# Patient Record
Sex: Female | Born: 1990 | Race: Black or African American | Hispanic: No | Marital: Married | Smoking: Never smoker
Health system: Southern US, Community
[De-identification: ages and names within clinical notes are randomized; demographics above are authoritative.]

## PROBLEM LIST (undated history)

## (undated) ENCOUNTER — Inpatient Hospital Stay (HOSPITAL_COMMUNITY): Payer: Self-pay

## (undated) DIAGNOSIS — D649 Anemia, unspecified: Secondary | ICD-10-CM

## (undated) DIAGNOSIS — O139 Gestational [pregnancy-induced] hypertension without significant proteinuria, unspecified trimester: Secondary | ICD-10-CM

## (undated) DIAGNOSIS — O24419 Gestational diabetes mellitus in pregnancy, unspecified control: Secondary | ICD-10-CM

## (undated) HISTORY — PX: WISDOM TOOTH EXTRACTION: SHX21

## (undated) HISTORY — DX: Gestational diabetes mellitus in pregnancy, unspecified control: O24.419

## (undated) HISTORY — PX: NO PAST SURGERIES: SHX2092

---

## 2014-01-04 ENCOUNTER — Encounter (HOSPITAL_COMMUNITY): Payer: Self-pay | Admitting: *Deleted

## 2014-01-04 ENCOUNTER — Inpatient Hospital Stay (HOSPITAL_COMMUNITY)
Admission: AD | Admit: 2014-01-04 | Discharge: 2014-01-04 | Disposition: A | Discharge status: Home / Self Care | Payer: Medicaid Other | Source: Ambulatory Visit | Attending: Obstetrics & Gynecology | Admitting: Obstetrics & Gynecology

## 2014-01-04 ENCOUNTER — Inpatient Hospital Stay (HOSPITAL_COMMUNITY): Payer: Medicaid Other

## 2014-01-04 DIAGNOSIS — O210 Mild hyperemesis gravidarum: Secondary | ICD-10-CM | POA: Insufficient documentation

## 2014-01-04 DIAGNOSIS — O219 Vomiting of pregnancy, unspecified: Secondary | ICD-10-CM

## 2014-01-04 DIAGNOSIS — R1031 Right lower quadrant pain: Secondary | ICD-10-CM | POA: Diagnosis not present

## 2014-01-04 LAB — ABO/RH: ABO/RH(D): O POS

## 2014-01-04 LAB — URINALYSIS, ROUTINE W REFLEX MICROSCOPIC
Glucose, UA: NEGATIVE mg/dL
Hgb urine dipstick: NEGATIVE
LEUKOCYTES UA: NEGATIVE
Nitrite: NEGATIVE
PH: 6 (ref 5.0–8.0)
Protein, ur: 30 mg/dL — AB
Specific Gravity, Urine: >1.03 — ABNORMAL HIGH (ref 1.005–1.030)
UROBILINOGEN UA: 0.2 mg/dL (ref 0.0–1.0)

## 2014-01-04 LAB — CBC
HEMATOCRIT: 32.6 % — AB (ref 36.0–46.0)
HEMOGLOBIN: 10.9 g/dL — AB (ref 12.0–15.0)
MCH: 23.7 pg — AB (ref 26.0–34.0)
MCHC: 33.4 g/dL (ref 30.0–36.0)
MCV: 71 fL — AB (ref 78.0–100.0)
Platelets: 296 10*3/uL (ref 150–400)
RBC: 4.59 MIL/uL (ref 3.87–5.11)
RDW: 15.3 % (ref 11.5–15.5)
WBC: 6.6 10*3/uL (ref 4.0–10.5)

## 2014-01-04 LAB — URINE MICROSCOPIC-ADD ON

## 2014-01-04 LAB — HCG, QUANTITATIVE, PREGNANCY: hCG, Beta Chain, Quant, S: 61548 m[IU]/mL — ABNORMAL HIGH (ref <5)

## 2014-01-04 LAB — WET PREP, GENITAL
CLUE CELLS WET PREP: NONE SEEN
TRICH WET PREP: NONE SEEN
YEAST WET PREP: NONE SEEN

## 2014-01-04 LAB — POCT PREGNANCY, URINE: Preg Test, Ur: POSITIVE — AB

## 2014-01-04 MED ORDER — ACETAMINOPHEN 325 MG PO TABS
650.0000 mg | ORAL_TABLET | Freq: Once | ORAL | Status: AC
  Administered 2014-01-04: 650 mg via ORAL
  Filled 2014-01-04: qty 2

## 2014-01-04 MED ORDER — PROMETHAZINE HCL 25 MG/ML IJ SOLN
Freq: Once | INTRAVENOUS | Status: AC
  Administered 2014-01-04: 14:00:00 via INTRAVENOUS
  Filled 2014-01-04: qty 1000

## 2014-01-04 MED ORDER — PROMETHAZINE HCL 25 MG PO TABS: 25.0000 mg | ORAL_TABLET | Freq: Four times a day (QID) | ORAL | Status: DC | PRN

## 2014-01-04 MED ORDER — LACTATED RINGERS IV BOLUS (SEPSIS)
1000.0000 mL | Freq: Once | INTRAVENOUS | Status: AC
  Administered 2014-01-04: 1000 mL via INTRAVENOUS

## 2014-01-04 NOTE — MAU Note (Signed)
Patient states she has been having nausea and vomiting and not being able to eat for about one week. Can keep water down. Having some lower abdominal cramping. Denies bleeding or discharge.

## 2014-01-04 NOTE — Discharge Instructions (Signed)
Prenatal Care  WHAT IS PRENATAL CARE?  Prenatal care means health care during your pregnancy, before your baby is born. It is very important to take care of yourself and your baby during your pregnancy by:   Getting early prenatal care. If you know you are pregnant, or think you might be pregnant, call your health care provider as soon as possible. Schedule a visit for a prenatal exam.  Getting regular prenatal care. Follow your health care provider's schedule for blood and other necessary tests. Do not miss appointments.  Doing everything you can to keep yourself and your baby healthy during your pregnancy.  Getting complete care. Prenatal care should include evaluation of the medical, dietary, educational, psychological, and social needs of you and your significant other. The medical and genetic history of your family and the family of your baby's father should be discussed with your health care provider.  Discussing with your health care provider:  Prescription, over-the-counter, and herbal medicines that you take.  Any history of substance abuse, alcohol use, smoking, and illegal drug use.  Any history of domestic abuse and violence.  Immunizations you have received.  Your nutrition and diet.  The amount of exercise you do.  Any environmental and occupational hazards to which you are exposed.  History of sexually transmitted infections for both you and your partner.  Previous pregnancies you have had. WHY IS PRENATAL CARE SO IMPORTANT?  By regularly seeing your health care provider, you help ensure that problems can be identified early so that they can be treated as soon as possible. Other problems might be prevented. Many studies have shown that early and regular prenatal care is important for the health of mothers and their babies.  HOW CAN I TAKE CARE OF MYSELF WHILE I AM PREGNANT?  Here are ways to take care of yourself and your baby:   Start or continue taking your  multivitamin with 400 micrograms (mcg) of folic acid every day.  Get early and regular prenatal care. It is very important to see a health care provider during your pregnancy. Your health care provider will check at each visit to make sure that you and your baby are healthy. If there are any problems, action can be taken right away to help you and your baby.  Eat a healthy diet that includes:  Fruits.  Vegetables.  Foods low in saturated fat.  Whole grains.  Calcium-rich foods, such as milk, yogurt, and hard cheeses.  Drink 6-8 glasses of liquids a day.  Unless your health care provider tells you not to, try to be physically active for 30 minutes, most days of the week. If you are pressed for time, you can get your activity in through 10-minute segments, three times a day.  Do not smoke, drink alcohol, or use drugs. These can cause long-term damage to your baby. Talk with your health care provider about steps to take to stop smoking. Talk with a member of your faith community, a counselor, a trusted friend, or your health care provider if you are concerned about your alcohol or drug use.  Ask your health care provider before taking any medicine, even over-the-counter medicines. Some medicines are not safe to take during pregnancy.  Get plenty of rest and sleep.  Avoid hot tubs and saunas during pregnancy.  Do not have X-rays taken unless absolutely necessary and with the recommendation of your health care provider. A lead shield can be placed on your abdomen to protect your baby when  X-rays are taken in other parts of your body. °· Do not empty the cat litter when you are pregnant. It may contain a parasite that causes an infection called toxoplasmosis, which can cause birth defects. Also, use gloves when working in garden areas used by cats. °· Do not eat uncooked or undercooked meats or fish. °· Do not eat soft, mold-ripened cheeses (Brie, Camembert, and chevre) or soft, blue-veined  cheese (Danish blue and Roquefort). °· Stay away from toxic chemicals like: °¨ Insecticides. °¨ Solvents (some cleaners or paint thinners). °¨ Lead. °¨ Mercury. °· Sexual intercourse may continue until the end of the pregnancy, unless you have a medical problem or there is a problem with the pregnancy and your health care provider tells you not to. °· Do not wear high-heel shoes, especially during the second half of the pregnancy. You can lose your balance and fall. °· Do not take long trips, unless absolutely necessary. Be sure to see your health care provider before going on the trip. °· Do not sit in one position for more than 2 hours when on a trip. °· Take a copy of your medical records when going on a trip. Know where a hospital is located in the city you are visiting, in case of an emergency. °· Most dangerous household products will have pregnancy warnings on their labels. Ask your health care provider about products if you are unsure. °· Limit or eliminate your caffeine intake from coffee, tea, sodas, medicines, and chocolate. °· Many women continue working through pregnancy. Staying active might help you stay healthier. If you have a question about the safety or the hours you work at your particular job, talk with your health care provider. °· Get informed: °¨ Read books. °¨ Watch videos. °¨ Go to childbirth classes for you and your significant other. °¨ Talk with experienced moms. °· Ask your health care provider about childbirth education classes for you and your partner. Classes can help you and your partner prepare for the birth of your baby. °· Ask about a baby doctor (pediatrician) and methods and pain medicine for labor, delivery, and possible cesarean delivery. °HOW OFTEN SHOULD I SEE MY HEALTH CARE PROVIDER DURING PREGNANCY?  °Your health care provider will give you a schedule for your prenatal visits. You will have visits more often as you get closer to the end of your pregnancy. An average  pregnancy lasts about 40 weeks.  °A typical schedule includes visiting your health care provider:  °· About once each month during your first 6 months of pregnancy. °· Every 2 weeks during the next 2 months. °· Weekly in the last month, until the delivery date. °Your health care provider will probably want to see you more often if: °· You are older than 35 years. °· Your pregnancy is high risk because you have certain health problems or problems with the pregnancy, such as: °¨ Diabetes. °¨ High blood pressure. °¨ The baby is not growing on schedule, according to the dates of the pregnancy. °Your health care provider will do special tests to make sure you and your baby are not having any serious problems. °WHAT HAPPENS DURING PRENATAL VISITS?  °· At your first prenatal visit, your health care provider will do a physical exam and talk to you about your health history and the health history of your partner and your family. Your health care provider will be able to tell you what date to expect your baby to be born on. °· Your   first physical exam will include checks of your blood pressure, measurements of your height and weight, and an exam of your pelvic organs. Your health care provider will do a Pap test if you have not had one recently and will do cultures of your cervix to make sure there is no infection.  At each prenatal visit, there will be tests of your blood, urine, blood pressure, weight, and the progress of the baby will be checked.  At your later prenatal visits, your health care provider will check how you are doing and how your baby is developing. You may have a number of tests done as your pregnancy progresses.  Ultrasound exams are often used to check on your baby's growth and health.  You may have more urine and blood tests, as well as special tests, if needed. These may include amniocentesis to examine fluid in the pregnancy sac, stress tests to check how the baby responds to contractions, or a  biophysical profile to measure your baby's well-being. Your health care provider will explain the tests and why they are necessary.  You should be tested for high blood sugar (gestational diabetes) between the 24th and 28th weeks of your pregnancy.  You should discuss with your health care provider your plans to breastfeed or bottle-feed your baby.  Each visit is also a chance for you to learn about staying healthy during pregnancy and to ask questions. Document Released: 07/05/2003 Document Revised: 07/07/2013 Document Reviewed: 12/18/2012 [facility] Patient Information 2015 [employer], [place]. This information is not intended to replace advice given to you by your health care provider. Make sure you discuss any questions you have with your health care provider. Morning Sickness Morning sickness is when you feel sick to your stomach (nauseous) during pregnancy. You may feel sick to your stomach and throw up (vomit). You may feel sick in the morning, but you can feel this way any time of day. Some women feel very sick to their stomach and cannot stop throwing up (hyperemesis gravidarum). HOME CARE  Only take medicines as told by your doctor.  Take multivitamins as told by your doctor. Taking multivitamins before getting pregnant can stop or lessen the harshness of morning sickness.  Eat dry toast or unsalted crackers before getting out of bed.  Eat 5 to 6 small meals a day.  Eat dry and bland foods like rice and baked potatoes.  Do not drink liquids with meals. Drink between meals.  Do not eat greasy, fatty, or spicy foods.  Have someone cook for you if the smell of food causes you to feel sick or throw up.  If you feel sick to your stomach after taking prenatal vitamins, take them at night or with a snack.  Eat protein when you need a snack (nuts, yogurt, cheese).  Eat unsweetened gelatins for dessert.  Wear a bracelet used for sea sickness (acupressure wristband).  Go to a doctor  that puts thin needles into certain body points (acupuncture) to improve how you feel.  Do not smoke.  Use a humidifier to keep the air in your house free of odors.  Get lots of fresh air. GET HELP IF:  You need medicine to feel better.  You feel dizzy or lightheaded.  You are losing weight. GET HELP RIGHT AWAY IF:   You feel very sick to your stomach and cannot stop throwing up.  You pass out (faint). MAKE SURE YOU:  Understand these instructions.  Will watch your condition.  Will get help  right away if you are not doing well or get worse. Document Released: 08/09/2004 Document Revised: 07/07/2013 Document Reviewed: 12/17/2012 [facility] Patient Information 2015 [place], [place]. This information is not intended to replace advice given to you by your health care provider. Make sure you discuss any questions you have with your health care provider.

## 2014-01-04 NOTE — MAU Provider Note (Signed)
Attestation of Attending Supervision of Advanced Practitioner (CNM/NP): Evaluation and management procedures were performed by the Advanced Practitioner under my supervision and collaboration. I have reviewed the Advanced Practitioner's note and chart, and I agree with the management and plan.  LEGGETT,KELLY H. 5:19 PM

## 2014-01-04 NOTE — MAU Provider Note (Signed)
History     CSN: [id number]  Arrival date and time: 01/04/14 1153   First Provider Initiated Contact with Patient 01/04/14 1303      Chief Complaint  Patient presents with  . Possible Pregnancy  . Emesis  . Abdominal Pain   HPI Comments: Julie [name] 23 y.o. G1P0 [redacted]w[redacted]d presents to MAU with nausea, vomiting, and right lower quad pain. The pain is "7" on 1-10 scale and is constant cramping. She has been unable to eat for one week.   Possible Pregnancy Associated symptoms include abdominal pain and vomiting.  Emesis  Associated symptoms include abdominal pain.  Abdominal Pain Associated symptoms include vomiting.      Past Medical History  Diagnosis Date  . Medical history non-contributory     Past Surgical History  Procedure Laterality Date  . No past surgeries      History reviewed. No pertinent family history.  History  Substance Use Topics  . Smoking status: Never Smoker   . Smokeless tobacco: Not on file  . Alcohol Use: No    Allergies: No Known Allergies  No prescriptions prior to admission    Review of Systems  Constitutional: Negative.   HENT: Negative.   Eyes: Negative.   Respiratory: Negative.   Cardiovascular: Negative.   Gastrointestinal: Positive for vomiting and abdominal pain.       Anorexia  Genitourinary: Negative.   Musculoskeletal: Negative.   Skin: Negative.   Neurological: Negative.   Psychiatric/Behavioral: Negative.    Physical Exam   Blood pressure 117/79, pulse 85, temperature 98.6 F (37 C), temperature source Oral, resp. rate 16, height 5\' 2"  (1.575 m), weight 66.951 kg (147 lb 9.6 oz), last menstrual period 11/16/2013, SpO2 100.00%.  Physical Exam  Constitutional: She is oriented to person, place, and time. She appears well-developed and well-nourished. No distress.  HENT:  Head: Normocephalic and atraumatic.  Eyes: Pupils are equal, round, and reactive to light.  Cardiovascular: Normal rate, regular rhythm and  normal heart sounds.   Respiratory: Effort normal and breath sounds normal. No respiratory distress.  GI: Soft. Bowel sounds are normal. She exhibits no distension. There is no tenderness. There is no rebound.  Genitourinary:  Genital:External negative Vaginal:small amount thick white discharge. No blood Cervix:closed/ thick Bimanual:nontender   Musculoskeletal: Normal range of motion.  Neurological: She is alert and oriented to person, place, and time.  Skin: Skin is warm.  Psychiatric: She has a normal mood and affect. Her behavior is normal. Judgment and thought content normal.   Results for orders placed during the hospital encounter of 01/04/14 (from the past 24 hour(s))  URINALYSIS, ROUTINE W REFLEX MICROSCOPIC     Status: Abnormal   Collection Time    01/04/14 12:15 PM      Result Value Ref Range   Color, Urine YELLOW  YELLOW   APPearance CLEAR  CLEAR   Specific Gravity, Urine >1.030 (*) 1.005 - 1.030   pH 6.0  5.0 - 8.0   Glucose, UA NEGATIVE  NEGATIVE mg/dL   Hgb urine dipstick NEGATIVE  NEGATIVE   Bilirubin Urine SMALL (*) NEGATIVE   Ketones, ur >80 (*) NEGATIVE mg/dL   Protein, ur 30 (*) NEGATIVE mg/dL   Urobilinogen, UA 0.2  0.0 - 1.0 mg/dL   Nitrite NEGATIVE  NEGATIVE   Leukocytes, UA NEGATIVE  NEGATIVE  URINE MICROSCOPIC-ADD ON     Status: Abnormal   Collection Time    01/04/14 12:15 PM      Result Value Ref  Range   WBC, UA 3-6  <3 WBC/hpf   RBC / HPF 0-2  <3 RBC/hpf   Bacteria, UA MANY (*) RARE   Urine-Other MUCOUS PRESENT    POCT PREGNANCY, URINE     Status: Abnormal   Collection Time    01/04/14 12:26 PM      Result Value Ref Range   Preg Test, Ur POSITIVE (*) NEGATIVE  CBC     Status: Abnormal   Collection Time    01/04/14  1:13 PM      Result Value Ref Range   WBC 6.6  4.0 - 10.5 K/uL   RBC 4.59  3.87 - 5.11 MIL/uL   Hemoglobin 10.9 (*) 12.0 - 15.0 g/dL   HCT 16.132.6 (*) 09.636.0 - 04.546.0 %   MCV 71.0 (*) 78.0 - 100.0 fL   MCH 23.7 (*) 26.0 - 34.0 pg    MCHC 33.4  30.0 - 36.0 g/dL   RDW 40.915.3  81.111.5 - 91.415.5 %   Platelets 296  150 - 400 K/uL  HCG, QUANTITATIVE, PREGNANCY     Status: Abnormal   Collection Time    01/04/14  1:13 PM      Result Value Ref Range   hCG, Beta Chain, Sharene ButtersQuant, [place] [id number] (*) <5 mIU/mL  ABO/RH     Status: None   Collection Time    01/04/14  1:13 PM      Result Value Ref Range   ABO/RH(D) O POS    WET PREP, GENITAL     Status: Abnormal   Collection Time    01/04/14  1:15 PM      Result Value Ref Range   Yeast Wet Prep HPF POC NONE SEEN  NONE SEEN   Trich, Wet Prep NONE SEEN  NONE SEEN   Clue Cells Wet Prep HPF POC NONE SEEN  NONE SEEN   WBC, Wet Prep HPF POC MANY (*) NONE SEEN   [place] Ob Comp Less 14 Wks  01/04/2014   CLINICAL DATA:  Right lower quadrant pain. Early pregnancy. Nausea and vomiting.  EXAM: OBSTETRIC <14 WK US AND TRANSVAGINAL OB US  TECHNIQUE: Both transabdominal and transvaginal ultrasound examinations were performed for complete evaluation of the gestation as well as the maternal uterus, adnexal regions, and pelvic cul-de-sac. Transvaginal technique was performed to assess early pregnancy.  COMPARISON:  None.  FINDINGS: Intrauterine gestational sac: Visualized/normal in shape.  Yolk sac:  Present.  Embryo:  Present.  Cardiac Activity: Present.  Heart Rate:  152 bpm  CRL:   13.2  mm   7 w 5 d                  US EDC: 08/18/2014  Small subchorionic hemorrhage noted.  Maternal uterus/adnexae: Ovaries were normal in appearance. Trace pelvic free fluid noted.  IMPRESSION: Single, viable intrauterine pregnancy as above. Gestational age [redacted] weeks 5 days by ultrasound.   Electronically Signed   By: Sebastian AcheAllen  Grady   On: 01/04/2014 14:52   [place] Ob Transvaginal  01/04/2014   CLINICAL DATA:  Right lower quadrant pain. Early pregnancy. Nausea and vomiting.  EXAM: OBSTETRIC <14 WK US AND TRANSVAGINAL OB US  TECHNIQUE: Both transabdominal and transvaginal ultrasound examinations were performed for complete evaluation of the  gestation as well as the maternal uterus, adnexal regions, and pelvic cul-de-sac. Transvaginal technique was performed to assess early pregnancy.  COMPARISON:  None.  FINDINGS: Intrauterine gestational sac: Visualized/normal in shape.  Yolk sac:  Present.  Embryo:  Present.  Cardiac Activity: Present.  Heart Rate:  152 bpm  CRL:   13.2  mm   7 w 5 d                  US EDC: 08/18/2014  Small subchorionic hemorrhage noted.  Maternal uterus/adnexae: Ovaries were normal in appearance. Trace pelvic free fluid noted.  IMPRESSION: Single, viable intrauterine pregnancy as above. Gestational age [redacted] weeks 5 days by ultrasound.   Electronically Signed   By: Sebastian AcheAllen  Grady   On: 01/04/2014 14:52     MAU Course  Procedures  MDM Wet prep, GC, Chlamydia, CBC, UA, U/S, ABORh, Quant Tylenol for pain IV LR with phenergan 25 mg IV LR Reviewed all labs and ultrasound with patient  Assessment and Plan   A: Nausea and vomiting in early pregnancy Abdominal pain in early pregnancy  P: Above orders Phenergan 25 mg po q6 hours Advised to start PNV today Advised to establish [facility] as[facility]/ list given Pregnancy verification    Carolynn ServeBarefoot, Linda Miller 01/04/2014, 1:27 PM

## 2014-01-05 LAB — GC/CHLAMYDIA PROBE AMP
CT PROBE, AMP APTIMA: NEGATIVE
GC PROBE AMP APTIMA: NEGATIVE

## 2014-03-03 ENCOUNTER — Encounter: Payer: Self-pay | Admitting: Physician Assistant

## 2014-03-04 ENCOUNTER — Other Ambulatory Visit (HOSPITAL_COMMUNITY): Payer: Self-pay | Admitting: Nurse Practitioner

## 2014-03-04 DIAGNOSIS — Z3689 Encounter for other specified antenatal screening: Secondary | ICD-10-CM

## 2014-03-04 LAB — OB RESULTS CONSOLE ANTIBODY SCREEN: Antibody Screen: NEGATIVE

## 2014-03-04 LAB — OB RESULTS CONSOLE RUBELLA ANTIBODY, IGM: Rubella: IMMUNE

## 2014-03-04 LAB — OB RESULTS CONSOLE RPR: RPR: NONREACTIVE

## 2014-03-04 LAB — OB RESULTS CONSOLE HEPATITIS B SURFACE ANTIGEN: HEP B S AG: NEGATIVE

## 2014-03-04 LAB — OB RESULTS CONSOLE VARICELLA ZOSTER ANTIBODY, IGG: Varicella: NON-IMMUNE/NOT IMMUNE

## 2014-03-04 LAB — OB RESULTS CONSOLE HIV ANTIBODY (ROUTINE TESTING): HIV: NONREACTIVE

## 2014-04-01 ENCOUNTER — Ambulatory Visit (HOSPITAL_COMMUNITY)
Admission: RE | Admit: 2014-04-01 | Discharge: 2014-04-01 | Disposition: A | Discharge status: Home / Self Care | Payer: Medicaid Other | Source: Ambulatory Visit | Attending: Nurse Practitioner | Admitting: Nurse Practitioner

## 2014-04-01 DIAGNOSIS — Z1389 Encounter for screening for other disorder: Secondary | ICD-10-CM | POA: Insufficient documentation

## 2014-04-01 DIAGNOSIS — Z3689 Encounter for other specified antenatal screening: Secondary | ICD-10-CM | POA: Insufficient documentation

## 2014-04-01 DIAGNOSIS — Z363 Encounter for antenatal screening for malformations: Secondary | ICD-10-CM | POA: Insufficient documentation

## 2014-05-06 ENCOUNTER — Other Ambulatory Visit (HOSPITAL_COMMUNITY): Payer: Self-pay | Admitting: Nurse Practitioner

## 2014-05-06 DIAGNOSIS — Z3689 Encounter for other specified antenatal screening: Secondary | ICD-10-CM

## 2014-05-17 ENCOUNTER — Encounter (HOSPITAL_COMMUNITY): Payer: Self-pay | Admitting: *Deleted

## 2014-05-19 ENCOUNTER — Ambulatory Visit (HOSPITAL_COMMUNITY)
Admission: RE | Admit: 2014-05-19 | Discharge: 2014-05-19 | Disposition: A | Discharge status: Home / Self Care | Payer: Medicaid Other | Source: Ambulatory Visit | Attending: Nurse Practitioner | Admitting: Nurse Practitioner

## 2014-05-19 DIAGNOSIS — Z36 Encounter for antenatal screening of mother: Secondary | ICD-10-CM | POA: Insufficient documentation

## 2014-05-19 DIAGNOSIS — Z0489 Encounter for examination and observation for other specified reasons: Secondary | ICD-10-CM | POA: Insufficient documentation

## 2014-05-19 DIAGNOSIS — Z3A26 26 weeks gestation of pregnancy: Secondary | ICD-10-CM | POA: Insufficient documentation

## 2014-05-19 DIAGNOSIS — Z3689 Encounter for other specified antenatal screening: Secondary | ICD-10-CM

## 2014-05-19 DIAGNOSIS — IMO0002 Reserved for concepts with insufficient information to code with codable children: Secondary | ICD-10-CM | POA: Insufficient documentation

## 2014-07-16 NOTE — L&D Delivery Note (Signed)
Delivery Note At 5:20 AM a viable female was delivered via Vaginal, Spontaneous Delivery (Presentation: ; Occiput Anterior).  APGAR: 9,9 ; weight pending .   Placenta status: Intact, Spontaneous.  Cord: 3v with the following complications: None.  Cord pH: na  Anesthesia: Epidural  Episiotomy:  none Lacerations: 2nd degree Suture Repair: 3.0 vicryl Est. Blood Loss (mL):  300 ml  Mom to postpartum.  Baby to Couplet care / Skin to Skin.  Rolm BookbinderMoss, Amber 08/25/2014, 5:48 AM   I was gloved and present for delivery. Agree with note Aviva SignsMarie L Janyla Biscoe, CNM

## 2014-07-26 LAB — OB RESULTS CONSOLE GC/CHLAMYDIA
CHLAMYDIA, DNA PROBE: NEGATIVE
Gonorrhea: NEGATIVE

## 2014-07-26 LAB — OB RESULTS CONSOLE GBS: GBS: POSITIVE

## 2014-08-23 ENCOUNTER — Other Ambulatory Visit (HOSPITAL_COMMUNITY): Payer: Self-pay | Admitting: Nurse Practitioner

## 2014-08-23 DIAGNOSIS — O480 Post-term pregnancy: Secondary | ICD-10-CM

## 2014-08-24 ENCOUNTER — Inpatient Hospital Stay (HOSPITAL_COMMUNITY)
Admission: AD | Admit: 2014-08-24 | Discharge: 2014-08-27 | DRG: 774 | Disposition: A | Discharge status: Home / Self Care | Payer: Medicaid Other | Source: Ambulatory Visit | Attending: Family Medicine | Admitting: Family Medicine

## 2014-08-24 ENCOUNTER — Encounter (HOSPITAL_COMMUNITY): Payer: Self-pay | Admitting: *Deleted

## 2014-08-24 DIAGNOSIS — Z3A40 40 weeks gestation of pregnancy: Secondary | ICD-10-CM | POA: Diagnosis present

## 2014-08-24 DIAGNOSIS — O1403 Mild to moderate pre-eclampsia, third trimester: Secondary | ICD-10-CM | POA: Diagnosis present

## 2014-08-24 DIAGNOSIS — Z6830 Body mass index (BMI) 30.0-30.9, adult: Secondary | ICD-10-CM

## 2014-08-24 DIAGNOSIS — O1490 Unspecified pre-eclampsia, unspecified trimester: Secondary | ICD-10-CM | POA: Diagnosis present

## 2014-08-24 DIAGNOSIS — O99214 Obesity complicating childbirth: Secondary | ICD-10-CM | POA: Diagnosis present

## 2014-08-24 DIAGNOSIS — O471 False labor at or after 37 completed weeks of gestation: Secondary | ICD-10-CM | POA: Diagnosis present

## 2014-08-24 DIAGNOSIS — O99824 Streptococcus B carrier state complicating childbirth: Secondary | ICD-10-CM | POA: Diagnosis present

## 2014-08-24 DIAGNOSIS — O1493 Unspecified pre-eclampsia, third trimester: Secondary | ICD-10-CM

## 2014-08-24 HISTORY — DX: Gestational (pregnancy-induced) hypertension without significant proteinuria, unspecified trimester: O13.9

## 2014-08-24 HISTORY — DX: Anemia, unspecified: D64.9

## 2014-08-24 LAB — COMPREHENSIVE METABOLIC PANEL
ALBUMIN: 3 g/dL — AB (ref 3.5–5.2)
ALT: 9 U/L (ref 0–35)
ANION GAP: 5 (ref 5–15)
AST: 17 U/L (ref 0–37)
Alkaline Phosphatase: 152 U/L — ABNORMAL HIGH (ref 39–117)
BILIRUBIN TOTAL: 0.6 mg/dL (ref 0.3–1.2)
BUN: 6 mg/dL (ref 6–23)
CALCIUM: 8.7 mg/dL (ref 8.4–10.5)
CO2: 21 mmol/L (ref 19–32)
Chloride: 110 mmol/L (ref 96–112)
Creatinine, Ser: 0.51 mg/dL (ref 0.50–1.10)
Glucose, Bld: 79 mg/dL (ref 70–99)
Potassium: 3.6 mmol/L (ref 3.5–5.1)
SODIUM: 136 mmol/L (ref 135–145)
TOTAL PROTEIN: 6.1 g/dL (ref 6.0–8.3)

## 2014-08-24 LAB — PROTEIN / CREATININE RATIO, URINE
Creatinine, Urine: 399 mg/dL
Protein Creatinine Ratio: 0.68 — ABNORMAL HIGH (ref 0.00–0.15)
Total Protein, Urine: 273 mg/dL

## 2014-08-24 LAB — TYPE AND SCREEN
ABO/RH(D): O POS
Antibody Screen: NEGATIVE

## 2014-08-24 LAB — CBC
HEMATOCRIT: 27.8 % — AB (ref 36.0–46.0)
Hemoglobin: 8.8 g/dL — ABNORMAL LOW (ref 12.0–15.0)
MCH: 23.2 pg — ABNORMAL LOW (ref 26.0–34.0)
MCHC: 31.7 g/dL (ref 30.0–36.0)
MCV: 73.2 fL — AB (ref 78.0–100.0)
Platelets: 197 10*3/uL (ref 150–400)
RBC: 3.8 MIL/uL — AB (ref 3.87–5.11)
RDW: 15.5 % (ref 11.5–15.5)
WBC: 6.8 10*3/uL (ref 4.0–10.5)

## 2014-08-24 LAB — URINALYSIS, ROUTINE W REFLEX MICROSCOPIC
BILIRUBIN URINE: NEGATIVE
Glucose, UA: NEGATIVE mg/dL
HGB URINE DIPSTICK: NEGATIVE
KETONES UR: 15 mg/dL — AB
Leukocytes, UA: NEGATIVE
Nitrite: NEGATIVE
PROTEIN: 100 mg/dL — AB
Specific Gravity, Urine: 1.025 (ref 1.005–1.030)
Urobilinogen, UA: 0.2 mg/dL (ref 0.0–1.0)
pH: 6.5 (ref 5.0–8.0)

## 2014-08-24 LAB — CBC WITH DIFFERENTIAL/PLATELET
Basophils Absolute: 0 10*3/uL (ref 0.0–0.1)
Basophils Relative: 0 % (ref 0–1)
EOS PCT: 1 % (ref 0–5)
Eosinophils Absolute: 0.1 10*3/uL (ref 0.0–0.7)
HCT: 27.9 % — ABNORMAL LOW (ref 36.0–46.0)
Hemoglobin: 9 g/dL — ABNORMAL LOW (ref 12.0–15.0)
LYMPHS ABS: 1.6 10*3/uL (ref 0.7–4.0)
Lymphocytes Relative: 28 % (ref 12–46)
MCH: 23.5 pg — ABNORMAL LOW (ref 26.0–34.0)
MCHC: 32.3 g/dL (ref 30.0–36.0)
MCV: 72.8 fL — ABNORMAL LOW (ref 78.0–100.0)
MONOS PCT: 9 % (ref 3–12)
Monocytes Absolute: 0.5 10*3/uL (ref 0.1–1.0)
Neutro Abs: 3.5 10*3/uL (ref 1.7–7.7)
Neutrophils Relative %: 62 % (ref 43–77)
PLATELETS: 207 10*3/uL (ref 150–400)
RBC: 3.83 MIL/uL — AB (ref 3.87–5.11)
RDW: 15.6 % — AB (ref 11.5–15.5)
WBC: 5.7 10*3/uL (ref 4.0–10.5)

## 2014-08-24 LAB — URINE MICROSCOPIC-ADD ON

## 2014-08-24 LAB — OB RESULTS CONSOLE VARICELLA ZOSTER ANTIBODY, IGG: Varicella: NON-IMMUNE/NOT IMMUNE

## 2014-08-24 MED ORDER — OXYTOCIN 40 UNITS IN LACTATED RINGERS INFUSION - SIMPLE MED
62.5000 mL/h | INTRAVENOUS | Status: DC
  Filled 2014-08-24: qty 1000

## 2014-08-24 MED ORDER — LACTATED RINGERS IV SOLN: 500.0000 mL | INTRAVENOUS | Status: DC | PRN

## 2014-08-24 MED ORDER — DIPHENHYDRAMINE HCL 50 MG/ML IJ SOLN: 12.5000 mg | INTRAMUSCULAR | Status: DC | PRN

## 2014-08-24 MED ORDER — LACTATED RINGERS IV SOLN
INTRAVENOUS | Status: DC
  Administered 2014-08-24: 14:00:00 via INTRAVENOUS

## 2014-08-24 MED ORDER — LACTATED RINGERS IV SOLN
INTRAVENOUS | Status: DC
  Administered 2014-08-24: via INTRAVENOUS

## 2014-08-24 MED ORDER — PHENYLEPHRINE 40 MCG/ML (10ML) SYRINGE FOR IV PUSH (FOR BLOOD PRESSURE SUPPORT)
80.0000 ug | PREFILLED_SYRINGE | INTRAVENOUS | Status: DC | PRN
  Filled 2014-08-24: qty 2

## 2014-08-24 MED ORDER — PENICILLIN G POTASSIUM 5000000 UNITS IJ SOLR
5.0000 10*6.[IU] | Freq: Once | INTRAVENOUS | Status: AC
  Administered 2014-08-24: 5 10*6.[IU] via INTRAVENOUS
  Filled 2014-08-24: qty 5

## 2014-08-24 MED ORDER — OXYCODONE-ACETAMINOPHEN 5-325 MG PO TABS: 2.0000 | ORAL_TABLET | ORAL | Status: DC | PRN

## 2014-08-24 MED ORDER — OXYCODONE-ACETAMINOPHEN 5-325 MG PO TABS: 1.0000 | ORAL_TABLET | ORAL | Status: DC | PRN

## 2014-08-24 MED ORDER — LIDOCAINE HCL (PF) 1 % IJ SOLN
30.0000 mL | INTRAMUSCULAR | Status: AC | PRN
  Administered 2014-08-25: 30 mL via SUBCUTANEOUS
  Filled 2014-08-24: qty 30

## 2014-08-24 MED ORDER — TERBUTALINE SULFATE 1 MG/ML IJ SOLN: 0.2500 mg | Freq: Once | INTRAMUSCULAR | Status: AC | PRN

## 2014-08-24 MED ORDER — EPHEDRINE 5 MG/ML INJ
10.0000 mg | INTRAVENOUS | Status: DC | PRN
  Filled 2014-08-24: qty 2

## 2014-08-24 MED ORDER — LACTATED RINGERS IV SOLN: 500.0000 mL | Freq: Once | INTRAVENOUS | Status: DC

## 2014-08-24 MED ORDER — CITRIC ACID-SODIUM CITRATE 334-500 MG/5ML PO SOLN
30.0000 mL | ORAL | Status: DC | PRN
  Filled 2014-08-24: qty 30

## 2014-08-24 MED ORDER — FENTANYL 2.5 MCG/ML BUPIVACAINE 1/10 % EPIDURAL INFUSION (WH - ANES)
14.0000 mL/h | INTRAMUSCULAR | Status: DC | PRN
Start: 2014-08-24 — End: 2014-08-25
  Administered 2014-08-25: 14 mL/h via EPIDURAL
  Filled 2014-08-24: qty 125

## 2014-08-24 MED ORDER — MAGNESIUM SULFATE BOLUS VIA INFUSION
4.0000 g | Freq: Once | INTRAVENOUS | Status: AC
  Administered 2014-08-24: 4 g via INTRAVENOUS
  Filled 2014-08-24: qty 500

## 2014-08-24 MED ORDER — LABETALOL HCL 5 MG/ML IV SOLN
20.0000 mg | INTRAVENOUS | Status: DC | PRN
  Administered 2014-08-25: 20 mg via INTRAVENOUS
  Filled 2014-08-24: qty 4

## 2014-08-24 MED ORDER — FENTANYL CITRATE 0.05 MG/ML IJ SOLN
100.0000 ug | INTRAMUSCULAR | Status: DC | PRN
  Administered 2014-08-24: 100 ug via INTRAVENOUS
  Filled 2014-08-24: qty 2

## 2014-08-24 MED ORDER — ACETAMINOPHEN 325 MG PO TABS: 650.0000 mg | ORAL_TABLET | ORAL | Status: DC | PRN

## 2014-08-24 MED ORDER — PHENYLEPHRINE 40 MCG/ML (10ML) SYRINGE FOR IV PUSH (FOR BLOOD PRESSURE SUPPORT)
80.0000 ug | PREFILLED_SYRINGE | INTRAVENOUS | Status: DC | PRN
  Filled 2014-08-24: qty 2
  Filled 2014-08-24: qty 20

## 2014-08-24 MED ORDER — OXYTOCIN BOLUS FROM INFUSION
500.0000 mL | INTRAVENOUS | Status: DC
  Administered 2014-08-25: 500 mL via INTRAVENOUS

## 2014-08-24 MED ORDER — MISOPROSTOL 50MCG HALF TABLET
50.0000 ug | ORAL_TABLET | ORAL | Status: DC | PRN
  Administered 2014-08-24 (×2): 50 ug via ORAL
  Filled 2014-08-24 (×6): qty 1

## 2014-08-24 MED ORDER — MAGNESIUM SULFATE 40 G IN LACTATED RINGERS - SIMPLE
2.0000 g/h | INTRAVENOUS | Status: DC
  Administered 2014-08-24: 2 g/h via INTRAVENOUS
  Filled 2014-08-24: qty 500

## 2014-08-24 MED ORDER — PENICILLIN G POTASSIUM 5000000 UNITS IJ SOLR
2.5000 10*6.[IU] | INTRAVENOUS | Status: DC
  Administered 2014-08-24 – 2014-08-25 (×3): 2.5 10*6.[IU] via INTRAVENOUS
  Filled 2014-08-24 (×7): qty 2.5

## 2014-08-24 MED ORDER — ONDANSETRON HCL 4 MG/2ML IJ SOLN: 4.0000 mg | Freq: Four times a day (QID) | INTRAMUSCULAR | Status: DC | PRN

## 2014-08-24 MED ORDER — HYDRALAZINE HCL 20 MG/ML IJ SOLN: 10.0000 mg | Freq: Once | INTRAMUSCULAR | Status: AC | PRN

## 2014-08-24 NOTE — H&P (Signed)
LABOR ADMISSION HISTORY AND PHYSICAL  Julie [name] is a 24 y.o. female G1P0 with IUP at 5731w1d by LMP cw 1648w5d CRL presenting for for labor evaluation. She reports +FMs, No LOF, no VB, no blurry vision, headaches, and RUQ pain.  She does have mild LE edema  She plans on breast feeding. Undecided about Birth control.  Dating: By LMP c/w 3048w5d CRL --->  Estimated Date of Delivery: 08/23/14   Prenatal History/Complications:  Past Medical History: Past Medical History  Diagnosis Date  . Medical history non-contributory     Past Surgical History: Past Surgical History  Procedure Laterality Date  . No past surgeries      Obstetrical History: OB History    Gravida Para Term Preterm AB TAB SAB Ectopic Multiple Living   1               Social History: History   Social History  . Marital Status: Married    Spouse Name: N/A    Number of Children: N/A  . Years of Education: N/A   Social History Main Topics  . Smoking status: Never Smoker   . Smokeless tobacco: None  . Alcohol Use: No  . Drug Use: No  . Sexual Activity: Yes    Birth Control/ Protection: None   Other Topics Concern  . None   Social History Narrative    Family History: History reviewed. No pertinent family history.  Allergies: No Known Allergies  Prescriptions prior to admission  Medication Sig Dispense Refill Last Dose  . promethazine (PHENERGAN) 25 MG tablet Take 1 tablet (25 mg total) by mouth every 6 (six) hours as needed for nausea or vomiting. (Patient not taking: Reported on 08/24/2014) 30 tablet 0      Review of Systems   All systems reviewed and negative except as stated in HPI  Blood pressure 128/84, pulse 76, temperature 98 F (36.7 C), temperature source Oral, resp. rate 18, height 5\' 2"  (1.575 m), weight 169 lb (76.658 kg), last menstrual period 11/16/2013, SpO2 100 %. General appearance: alert and cooperative Lungs: clear to auscultation bilaterally Heart: regular rate and  rhythm Abdomen: soft, non-tender; bowel sounds normal Extremities: Homans sign is negative, no sign of DVT Dilation: 1 Effacement (%): 80 Station: -3 Exam by:: Ginger Morris RN   Prenatal labs: ABO, Rh: --/--/O POS (06/22 1313) Antibody:   Rubella:   RPR:    HBsAg:    HIV:    GBS:    1 hr Glucola 67 Genetic screening  Neg quad Anatomy US normal   Results for orders placed or performed during the hospital encounter of 08/24/14 (from the past 24 hour(s))  Protein / creatinine ratio, urine   Collection Time: 08/24/14  9:00 AM  Result Value Ref Range   Creatinine, Urine 399.00 mg/dL   Total Protein, Urine 273 mg/dL   Protein Creatinine Ratio 0.68 (H) 0.00 - 0.15  Urinalysis, Routine w reflex microscopic   Collection Time: 08/24/14  9:00 AM  Result Value Ref Range   Color, Urine YELLOW YELLOW   APPearance HAZY (A) CLEAR   Specific Gravity, Urine 1.025 1.005 - 1.030   pH 6.5 5.0 - 8.0   Glucose, UA NEGATIVE NEGATIVE mg/dL   Hgb urine dipstick NEGATIVE NEGATIVE   Bilirubin Urine NEGATIVE NEGATIVE   Ketones, ur 15 (A) NEGATIVE mg/dL   Protein, ur 161100 (A) NEGATIVE mg/dL   Urobilinogen, UA 0.2 0.0 - 1.0 mg/dL   Nitrite NEGATIVE NEGATIVE   Leukocytes, UA NEGATIVE  NEGATIVE  Urine microscopic-add on   Collection Time: 08/24/14  9:00 AM  Result Value Ref Range   Squamous Epithelial / LPF FEW (A) RARE   WBC, UA 0-2 <3 WBC/hpf   RBC / HPF 0-2 <3 RBC/hpf   Urine-Other MUCOUS PRESENT   CBC with Differential   Collection Time: 08/24/14  9:15 AM  Result Value Ref Range   WBC 5.7 4.0 - 10.5 K/uL   RBC 3.83 (L) 3.87 - 5.11 MIL/uL   Hemoglobin 9.0 (L) 12.0 - 15.0 g/dL   HCT 16.1 (L) 09.6 - 04.5 %   MCV 72.8 (L) 78.0 - 100.0 fL   MCH 23.5 (L) 26.0 - 34.0 pg   MCHC 32.3 30.0 - 36.0 g/dL   RDW 40.9 (H) 81.1 - 91.4 %   Platelets 207 150 - 400 K/uL   Neutrophils Relative % 62 43 - 77 %   Neutro Abs 3.5 1.7 - 7.7 K/uL   Lymphocytes Relative 28 12 - 46 %   Lymphs Abs 1.6 0.7 - 4.0  K/uL   Monocytes Relative 9 3 - 12 %   Monocytes Absolute 0.5 0.1 - 1.0 K/uL   Eosinophils Relative 1 0 - 5 %   Eosinophils Absolute 0.1 0.0 - 0.7 K/uL   Basophils Relative 0 0 - 1 %   Basophils Absolute 0.0 0.0 - 0.1 K/uL  Comprehensive metabolic panel   Collection Time: 08/24/14  9:15 AM  Result Value Ref Range   Sodium 136 135 - 145 mmol/L   Potassium 3.6 3.5 - 5.1 mmol/L   Chloride 110 96 - 112 mmol/L   CO2 21 19 - 32 mmol/L   Glucose, Bld 79 70 - 99 mg/dL   BUN 6 6 - 23 mg/dL   Creatinine, Ser 7.82 0.50 - 1.10 mg/dL   Calcium 8.7 8.4 - 95.6 mg/dL   Total Protein 6.1 6.0 - 8.3 g/dL   Albumin 3.0 (L) 3.5 - 5.2 g/dL   AST 17 0 - 37 U/L   ALT 9 0 - 35 U/L   Alkaline Phosphatase 152 (H) 39 - 117 U/L   Total Bilirubin 0.6 0.3 - 1.2 mg/dL   GFR calc non Af Amer >90 >90 mL/min   GFR calc Af Amer >90 >90 mL/min   Anion gap 5 5 - 15    Patient Active Problem List   Diagnosis Date Noted  . [redacted] weeks gestation of pregnancy   . Evaluate anatomy not seen on prior sonogram     Assessment: Julie [name] is a 25 y.o. G1P0 at [redacted]w[redacted]d here for labor evaluation with 1 elevated blood pressure in MAU, no severe range now with proteinuria => admit for IOL 2/2 preEclampsia with out severe features  #Labor:FB, cytotec when able #Pain: Epidural once foley out #FWB: Cat I #ID:  GBS + => PCN once FB out #MOF: breast #MOC: undecided #preE: no severe fx, no mag for now, if severe range BP => magnesium  [name] ROCIO 08/24/2014, 1:02 PM

## 2014-08-24 NOTE — Plan of Care (Signed)
Problem: Phase I Progression Outcomes Goal: Assess per MD/Nurse,Routine-VS,FHR,UC,Head to Toe assess Outcome: Progressing Pt admitted with elevated BP's, denies headache, visual disturbances, epigastric pain, currently on Magnesium Sulfate, Labetalol prn, instructed to call nurse if experiences before mentioned symtoms

## 2014-08-24 NOTE — Progress Notes (Signed)
Patient ID: Julie [name], female   DOB: [birth year], 24 y.o.   MRN: [id number] Doing well.  Comfortable with contractions  Filed Vitals:   08/24/14 1700 08/24/14 1800 08/24/14 1900 08/24/14 2000  BP: 153/103 146/100 154/97 145/98  Pulse: 90 78 100 96  Temp:    98.4 F (36.9 C)  TempSrc:    Oral  Resp: 18 20 18 20   Height:      Weight:      SpO2:        FHR reassuring UCs irregular  Plan cytotec until effaced then Pitocin

## 2014-08-24 NOTE — MAU Note (Signed)
Pt presents to MAU with complaints of contractions that started early this morning. Denies any vaginal bleeding or LOF. Reports he blood pressure was elevated in the office yesterday

## 2014-08-25 ENCOUNTER — Inpatient Hospital Stay (HOSPITAL_COMMUNITY): Payer: Medicaid Other | Admitting: Anesthesiology

## 2014-08-25 ENCOUNTER — Encounter (HOSPITAL_COMMUNITY): Payer: Self-pay | Admitting: Anesthesiology

## 2014-08-25 DIAGNOSIS — O1403 Mild to moderate pre-eclampsia, third trimester: Secondary | ICD-10-CM

## 2014-08-25 DIAGNOSIS — O99824 Streptococcus B carrier state complicating childbirth: Secondary | ICD-10-CM

## 2014-08-25 LAB — MRSA PCR SCREENING: MRSA BY PCR: NEGATIVE

## 2014-08-25 LAB — CBC
HCT: 23.2 % — ABNORMAL LOW (ref 36.0–46.0)
Hemoglobin: 7.6 g/dL — ABNORMAL LOW (ref 12.0–15.0)
MCH: 23.6 pg — AB (ref 26.0–34.0)
MCHC: 32.8 g/dL (ref 30.0–36.0)
MCV: 72 fL — AB (ref 78.0–100.0)
Platelets: 184 10*3/uL (ref 150–400)
RBC: 3.22 MIL/uL — ABNORMAL LOW (ref 3.87–5.11)
RDW: 15.4 % (ref 11.5–15.5)
WBC: 15.8 10*3/uL — ABNORMAL HIGH (ref 4.0–10.5)

## 2014-08-25 LAB — PLATELET COUNT: Platelets: 205 10*3/uL (ref 150–400)

## 2014-08-25 LAB — HIV ANTIBODY (ROUTINE TESTING W REFLEX): HIV SCREEN 4TH GENERATION: NONREACTIVE

## 2014-08-25 LAB — RPR: RPR Ser Ql: NONREACTIVE

## 2014-08-25 MED ORDER — PRENATAL MULTIVITAMIN CH
1.0000 | ORAL_TABLET | Freq: Every day | ORAL | Status: DC
  Administered 2014-08-25 – 2014-08-26 (×2): 1 via ORAL
  Filled 2014-08-25 (×2): qty 1

## 2014-08-25 MED ORDER — LACTATED RINGERS IV SOLN
INTRAVENOUS | Status: DC
  Administered 2014-08-25 (×2): via INTRAVENOUS

## 2014-08-25 MED ORDER — IBUPROFEN 600 MG PO TABS
600.0000 mg | ORAL_TABLET | Freq: Four times a day (QID) | ORAL | Status: DC
  Administered 2014-08-25 – 2014-08-27 (×9): 600 mg via ORAL
  Filled 2014-08-25 (×9): qty 1

## 2014-08-25 MED ORDER — TETANUS-DIPHTH-ACELL PERTUSSIS 5-2.5-18.5 LF-MCG/0.5 IM SUSP
0.5000 mL | Freq: Once | INTRAMUSCULAR | Status: DC
  Filled 2014-08-25: qty 0.5

## 2014-08-25 MED ORDER — MAGNESIUM SULFATE 40 G IN LACTATED RINGERS - SIMPLE
2.0000 g/h | INTRAVENOUS | Status: AC
  Administered 2014-08-25: 2 g/h via INTRAVENOUS
  Filled 2014-08-25: qty 500

## 2014-08-25 MED ORDER — BENZOCAINE-MENTHOL 20-0.5 % EX AERO
1.0000 "application " | INHALATION_SPRAY | CUTANEOUS | Status: DC | PRN
  Administered 2014-08-25: 1 via TOPICAL
  Filled 2014-08-25: qty 56

## 2014-08-25 MED ORDER — WITCH HAZEL-GLYCERIN EX PADS: 1.0000 "application " | MEDICATED_PAD | CUTANEOUS | Status: DC | PRN

## 2014-08-25 MED ORDER — ONDANSETRON HCL 4 MG PO TABS: 4.0000 mg | ORAL_TABLET | ORAL | Status: DC | PRN

## 2014-08-25 MED ORDER — SENNOSIDES-DOCUSATE SODIUM 8.6-50 MG PO TABS
2.0000 | ORAL_TABLET | ORAL | Status: DC
  Administered 2014-08-26 (×2): 2 via ORAL
  Filled 2014-08-25 (×3): qty 2

## 2014-08-25 MED ORDER — LANOLIN HYDROUS EX OINT: TOPICAL_OINTMENT | CUTANEOUS | Status: DC | PRN

## 2014-08-25 MED ORDER — FENTANYL 2.5 MCG/ML BUPIVACAINE 1/10 % EPIDURAL INFUSION (WH - ANES)
INTRAMUSCULAR | Status: DC | PRN
  Administered 2014-08-25: 14 mL/h via EPIDURAL

## 2014-08-25 MED ORDER — LIDOCAINE HCL (PF) 1 % IJ SOLN
INTRAMUSCULAR | Status: DC | PRN
  Administered 2014-08-25 (×2): 4 mL

## 2014-08-25 MED ORDER — SIMETHICONE 80 MG PO CHEW: 80.0000 mg | CHEWABLE_TABLET | ORAL | Status: DC | PRN

## 2014-08-25 MED ORDER — OXYCODONE-ACETAMINOPHEN 5-325 MG PO TABS: 2.0000 | ORAL_TABLET | ORAL | Status: DC | PRN

## 2014-08-25 MED ORDER — ONDANSETRON HCL 4 MG/2ML IJ SOLN: 4.0000 mg | INTRAMUSCULAR | Status: DC | PRN

## 2014-08-25 MED ORDER — DIPHENHYDRAMINE HCL 25 MG PO CAPS: 25.0000 mg | ORAL_CAPSULE | Freq: Four times a day (QID) | ORAL | Status: DC | PRN

## 2014-08-25 MED ORDER — OXYCODONE-ACETAMINOPHEN 5-325 MG PO TABS: 1.0000 | ORAL_TABLET | ORAL | Status: DC | PRN

## 2014-08-25 MED ORDER — OXYTOCIN 40 UNITS IN LACTATED RINGERS INFUSION - SIMPLE MED: 1.0000 m[IU]/min | INTRAVENOUS | Status: DC

## 2014-08-25 MED ORDER — TERBUTALINE SULFATE 1 MG/ML IJ SOLN: 0.2500 mg | Freq: Once | INTRAMUSCULAR | Status: DC | PRN

## 2014-08-25 MED ORDER — ZOLPIDEM TARTRATE 5 MG PO TABS: 5.0000 mg | ORAL_TABLET | Freq: Every evening | ORAL | Status: DC | PRN

## 2014-08-25 MED ORDER — DIBUCAINE 1 % RE OINT: 1.0000 "application " | TOPICAL_OINTMENT | RECTAL | Status: DC | PRN

## 2014-08-25 NOTE — Progress Notes (Signed)
Julie [name] is a 24 y.o. G1P0 at 2462w2d admitted for induction of labor due to preeclampsia with severe features.  Subjective: Evaluated pt for increased contraction frequency  Objective: BP 146/121 mmHg  Pulse 81  Temp(Src) 98.4 F (36.9 C) (Axillary)  Resp 18  Ht 5\' 2"  (1.575 m)  Wt 81.194 kg (179 lb)  BMI 32.73 kg/m2  SpO2 100%  LMP 11/16/2013 I/O last 3 completed shifts: In: 1305.4 [P.O.:250; I.V.:705.4; IV Piggyback:350] Out: 900 [Urine:900] Total I/O In: 740 [P.O.:240; I.V.:500] Out: 375 [Urine:375]  FHT:  FHR: 110 bpm, variability: moderate,  accelerations:  Present,  decelerations:  Absent UC:   regular, every 1 minutes SVE:   Dilation: 7 Effacement (%): 100 Station: 0 Exam by:: Dr. Alfonso EllisMose  Labs: Lab Results  Component Value Date   WBC 6.8 08/24/2014   HGB 8.8* 08/24/2014   HCT 27.8* 08/24/2014   MCV 73.2* 08/24/2014   PLT 205 08/25/2014    Assessment / Plan: IOL due to preeclampsia with severe features  Labor: progressing well, on 1 pit. will continue to increase pit as tolerated Preeclampsia:  on magnesium sulfate and no signs or symptoms of toxicity Fetal Wellbeing:  Category I Pain Control:  Epidural I/D:  GBS pos Anticipated MOD:  NSVD  Julie [name] 08/25/2014, 3:00 AM

## 2014-08-25 NOTE — Progress Notes (Signed)
Patient ID: Julie [name], female   DOB: 1990/08/19, 24 y.o.   MRN: [id number] Doing well, comfortable with epidural  Filed Vitals:   08/25/14 0300 08/25/14 0305 08/25/14 0310 08/25/14 0330  BP: 166/113   159/108  Pulse: 81 84 88 86  Temp:      TempSrc:      Resp: 18   18  Height:      Weight:      SpO2: 100% 100% 100%    FHR baseline 110s, on review, this has been the baseline all along + accels with scalp stim. No decels  UCs every 3 minutes  Dilation: Lip/rim Effacement (%): 100 Cervical Position: Anterior Station: 0 Presentation: Vertex Exam by:: Julie [name]  AROM clear fluid IUPC and ISE inserted.

## 2014-08-25 NOTE — Lactation Note (Addendum)
This note was copied from the chart of Julie [name]. Lactation Consultation Note  Patient Name: Julie Jae DireSuhailat Mohammed [name] ZOXWR'UToday's Date: 08/25/2014 Reason for consult: Initial assessment Mom in AICU, baby 4 hours of life. Mom states that she is having more trouble latching to right breast in cross-cradle position. Mom return-demonstrated hand expression with colostrum visible in drops. Assisted mom to latch baby to right breast in football position. Baby latches deeply, suckling rhythmically in short bursts when stimulated, with a few swallows noted. Baby sleepy at breast. Discussed normal newborn infant behavior. Enc mom to offer lots of STS and nurse with cues, 8-12 times/24 hours. [facility], aware of [facility], community resources, and [facility] after [facility]. Enc mom to call for assistance with latching as needed.   Maternal Data Has patient been taught Hand Expression?: Yes Does the patient have breastfeeding experience prior to this delivery?: No  Feeding Feeding Type: Breast Fed Length of feed:  (LC Assessed first 10 minutes of BF.)  LATCH Score/Interventions Latch: Grasps breast easily, tongue down, lips flanged, rhythmical sucking. Intervention(s): Adjust position;Assist with latch;Breast compression  Audible Swallowing: A few with stimulation Intervention(s): Skin to skin  Type of Nipple: Everted at rest and after stimulation  Comfort (Breast/Nipple): Soft / non-tender     Hold (Positioning): Assistance needed to correctly position infant at breast and maintain latch. Intervention(s): Breastfeeding basics reviewed;Support Pillows;Position options;Skin to skin  LATCH Score: 8  Lactation Tools Discussed/Used     Consult Status Consult Status: Follow-up Date: 08/26/14 Follow-up type: In-patient    Geralynn OchsWILLIARD, Dusti Tetro 08/25/2014, 9:59 AM

## 2014-08-25 NOTE — Anesthesia Procedure Notes (Addendum)
Epidural Patient location during procedure: OB Start time: 08/25/2014 12:20 AM  Staffing Anesthesiologist: Orvilla Truett A. Performed by: anesthesiologist   Preanesthetic Checklist Completed: patient identified, site marked, surgical consent, pre-op evaluation, timeout performed, IV checked, risks and benefits discussed and monitors and equipment checked  Epidural Patient position: sitting Prep: site prepped and draped and DuraPrep Patient monitoring: continuous pulse ox and blood pressure Approach: midline Location: L3-L4 Injection technique: LOR air  Needle:  Needle type: Tuohy  Needle gauge: 17 G Needle length: 9 cm and 9 Needle insertion depth: 5 cm cm Catheter type: closed end flexible Catheter size: 19 Gauge Catheter at skin depth: 10 cm Test dose: negative and Other  Assessment Events: blood not aspirated, injection not painful, no injection resistance, negative IV test and no paresthesia  Additional Notes Patient identified. Risks and benefits discussed including failed block, incomplete  Pain control, post dural puncture headache, nerve damage, paralysis, blood pressure Changes, nausea, vomiting, reactions to medications-both toxic and allergic and post Partum back pain. All questions were answered. Patient expressed understanding and wished to proceed. Sterile technique was used throughout procedure. Epidural site was Dressed with sterile barrier dressing. No paresthesias, signs of intravascular injection Or signs of intrathecal spread were encountered.  Patient was more comfortable after the epidural was dosed. Please see RN's note for documentation of vital signs and FHR which are stable.

## 2014-08-25 NOTE — Progress Notes (Signed)
10:15 pt up to BR, tolerated well. Pericare, & CHG bath done, gown changed. When pt stood up she c/o of feeling faint - pulled cord to call for assistance. Pt was able to stand up & onto the [facility] & sit on seat. Wheeled back to bed & assisted back into bed. Eyes dazed & pt weak, but no loss of consciousness. Ammonia inhalant used. VS obtained.   08/25/14 1035  Vitals  BP (!) 145/91 mmHg (after feeling faint in BR & getting back to bed)  MAP (mmHg) 103  Pulse Rate 86  Oxygen Therapy  SpO2 100 %  O2 Device Room Air

## 2014-08-25 NOTE — Progress Notes (Signed)
UR chart review completed.  

## 2014-08-25 NOTE — Anesthesia Postprocedure Evaluation (Signed)
  Anesthesia Post-op Note  Patient: Julie [name]  Procedure(s) Performed: * No procedures listed *  Patient Location: A-ICU  Anesthesia Type:Epidural  Level of Consciousness: awake, alert , oriented and patient cooperative  Airway and Oxygen Therapy: Patient Spontanous Breathing  Post-op Pain: none  Post-op Assessment: Post-op Vital signs reviewed, Patient's Cardiovascular Status Stable, Respiratory Function Stable, Patent Airway, No headache, No backache, No residual numbness and No residual motor weakness  Post-op Vital Signs: Reviewed and stable  Last Vitals:  Filed Vitals:   08/25/14 0800  BP: 130/87  Pulse: 95  Temp:   Resp:     Complications: No apparent anesthesia complications

## 2014-08-25 NOTE — Progress Notes (Signed)
Patient ID: Jae DireSuhailat Mohammed Dan-Awudu, female   DOB: 28-Aug-1990, 24 y.o.   MRN: [id number] Doing well but requesting epidural  Filed Vitals:   08/24/14 2000 08/24/14 2100 08/24/14 2200 08/24/14 2300  BP: 145/98 133/84 140/81 127/96  Pulse: 96 94 94 95  Temp: 98.4 F (36.9 C)   98.4 F (36.9 C)  TempSrc: Oral   Axillary  Resp: 20  18 20   Height:      Weight:      SpO2:       FHR stable UCs every 2 min  Dilation: 4 Effacement (%): 90 Cervical Position: Anterior Station: -2 Presentation: Vertex (per U/S) Exam by:: Dennis BastVeronica Mensah  Will order epidural

## 2014-08-25 NOTE — Anesthesia Preprocedure Evaluation (Signed)
Anesthesia Evaluation  Patient identified by MRN, date of birth, ID band Patient awake    Reviewed: Allergy & Precautions, Patient's Chart, lab work & pertinent test results  Airway Mallampati: III  TM Distance: >3 FB Neck ROM: Full    Dental no notable dental hx. (+) Teeth Intact   Pulmonary neg pulmonary ROS,  breath sounds clear to auscultation  Pulmonary exam normal       Cardiovascular hypertension, Pt. on medications Rhythm:Regular Rate:Normal     Neuro/Psych negative neurological ROS  negative psych ROS   GI/Hepatic negative GI ROS, Neg liver ROS,   Endo/Other  obesity  Renal/GU negative Renal ROS  negative genitourinary   Musculoskeletal   Abdominal (+) + obese,   Peds  Hematology  (+) anemia ,   Anesthesia Other Findings   Reproductive/Obstetrics                             Anesthesia Physical Anesthesia Plan  ASA: III  Anesthesia Plan: Epidural   Post-op Pain Management:    Induction:   Airway Management Planned: Natural Airway  Additional Equipment:   Intra-op Plan:   Post-operative Plan:   Informed Consent: I have reviewed the patients History and Physical, chart, labs and discussed the procedure including the risks, benefits and alternatives for the proposed anesthesia with the patient or authorized representative who has indicated his/her understanding and acceptance.     Plan Discussed with: Anesthesiologist  Anesthesia Plan Comments:         Anesthesia Quick Evaluation

## 2014-08-26 NOTE — Progress Notes (Signed)
Pt had a 15 minute period of tachycardia while sleeping. When verifying placement of pulse ox, I awakened patient; she denied any SOB or chest tightness. Tachycardia spontaneously resolved.

## 2014-08-26 NOTE — Lactation Note (Signed)
This note was copied from the chart of Julie [name]. Lactation Consultation Note  Patient Name: Julie [name] RUEAV'WToday's Date: 08/26/2014 Reason for consult: Follow-up assessment Baby 29 hours of life. Mom reports some nipple soreness, but otherwise, baby nursing well and often. [facility]. Mom nursing "hands-free" with baby propped on pillow and not tucked in close to mom's breast. Discussed cross-cradle and football position, then assisted mom to position baby in football position. Baby latched easily and deeply, suckling rhythmically with intermittent swallows noted. Noted that mom had the beginnings of a compression stripe, discussed with mom that this will be result if baby not supported at breast to maintain a deep latch. Demonstrated how to tug chin and flange lower lip and mom reports increased comfort. Enc mom to continue to nurse with cues and demonstrated how to break seal when needing to re-latch in order not to damage nipple tissue. Enc mom to ask for assistance with latching/nursing as needed.   Maternal Data    Feeding Feeding Type: Breast Fed Length of feed:  (LC assessed 10 minutes of BF.)  LATCH Score/Interventions Latch: Grasps breast easily, tongue down, lips flanged, rhythmical sucking. Intervention(s): Adjust position;Assist with latch;Breast compression  Audible Swallowing: Spontaneous and intermittent  Type of Nipple: Everted at rest and after stimulation  Comfort (Breast/Nipple): Filling, red/small blisters or bruises, mild/mod discomfort  Problem noted: Mild/Moderate discomfort  Hold (Positioning): Assistance needed to correctly position infant at breast and maintain latch. Intervention(s): Breastfeeding basics reviewed;Support Pillows;Position options;Skin to skin  LATCH Score: 8  Lactation Tools Discussed/Used     Consult Status Consult Status: Follow-up Date: 08/27/14 Follow-up type:  In-patient    Geralynn OchsWILLIARD, Mabell Esguerra 08/26/2014, 10:55 AM

## 2014-08-26 NOTE — Progress Notes (Signed)
Post Partum Day 1 Subjective: no complaints, up ad lib, voiding and tolerating PO  Objective: Blood pressure 115/71, pulse 93, temperature 98.1 F (36.7 C), temperature source Oral, resp. rate 16, height 5\' 2"  (1.575 m), weight 168 lb 6.4 oz (76.386 kg), last menstrual period 11/16/2013, SpO2 100 %, unknown if currently breastfeeding.   Filed Vitals:   08/26/14 0600 08/26/14 0607 08/26/14 0613 08/26/14 0700  BP: 131/76   115/71  Pulse: 110 120 120 93  Temp:      TempSrc:      Resp: 16 16 16 16   Height:      Weight:      SpO2: 100% 100% 100% 100%    Physical Exam:  General: alert, cooperative and no distress Lochia: appropriate Uterine Fundus: firm Incision:  DVT Evaluation: No evidence of DVT seen on physical exam. DTR 3+  Recent Labs  08/24/14 1330 08/25/14 0800  HGB 8.8* 7.6*  HCT 27.8* 23.2*    Assessment/Plan: Plan for discharge tomorrow and Breastfeeding   LOS: 2 days   Julie [name] H 08/26/2014, 7:27 AM

## 2014-08-27 ENCOUNTER — Ambulatory Visit (HOSPITAL_COMMUNITY): Admission: RE | Admit: 2014-08-27 | Payer: Medicaid Other | Source: Ambulatory Visit

## 2014-08-27 NOTE — Discharge Summary (Signed)
Obstetric Discharge Summary Reason for Admission: induction of labor 2/2 Preeclampsia w/ severe features Prenatal Procedures: NST, Preeclampsia and ultrasound Intrapartum Procedures: spontaneous vaginal delivery and GBS prophylaxis Postpartum Procedures: antibiotics Complications-Operative and Postpartum: 2nd degree perineal laceration   Breast feeding, Depo shot for BC  BPs in severe range and Pr/Cr of 0.68 prior to delivery. Patient was treated postpartum for preeclampsia w/ 2mg  Magnesium x4. Patient tolerated well and had no further complications.  HEMOGLOBIN  Date Value Ref Range Status  08/25/2014 7.6* 12.0 - 15.0 g/dL Final   HCT  Date Value Ref Range Status  08/25/2014 23.2* 36.0 - 46.0 % Final    Physical Exam:  General: alert, cooperative, appears stated age and no distress Lochia: appropriate Uterine Fundus: firm Incision: n/a DVT Evaluation: No evidence of DVT seen on physical exam. No cords or calf tenderness. No significant calf/ankle edema.  Discharge Diagnoses: Term Pregnancy-delivered and Preelampsia  Discharge Information: Date: 08/27/2014 Activity: pelvic rest Diet: routine Medications: PNV Condition: stable Instructions: refer to practice specific booklet Discharge to: home Follow-up Information    [facility] Go [facility] 3 days.   Specialty:  Home Health Services   Why:  Blood pressure check   Contact information:   [facility] [address] [place] [address] [phone]       [facility]. Schedule an appointment as soon as possible for a visit in 6 weeks.   Specialty:  Home Health Services   Contact information:   [facility] [address] [place] [address] [phone]       Newborn Data: Live born female  Birth Weight: 7 lb 3.9 oz (3286 g) APGAR: 8, 9  Home with  mother.  Kathee DeltonMcKeag, Kalden Wanke D 08/27/2014, 7:49 AM

## 2014-08-27 NOTE — Lactation Note (Signed)
This note was copied from the chart of Julie [name]. Lactation Consultation Note  Nipples tender.  Positional stripe on left nipple.  Reviewed how to achieve a deeper latch. Mother breastfeeding in side lying position.  Mother states it hurts when baby first latches then subsides. Encouraged mother to guide baby by back of shoulders deep onto breast. Sucks and swallows heard.  LS9. Provided mother w/comfort gels and hand pump. Reviewed engorgement care and hand pump use.   Patient Name: Julie [name] ZOXWR'UToday's Date: 08/27/2014 Reason for consult: Follow-up assessment   Maternal Data    Feeding Feeding Type: Breast Fed (latched upon entering)  LATCH Score/Interventions Latch: Grasps breast easily, tongue down, lips flanged, rhythmical sucking. Intervention(s): Breast massage  Audible Swallowing: Spontaneous and intermittent  Type of Nipple: Everted at rest and after stimulation  Comfort (Breast/Nipple): Filling, red/small blisters or bruises, mild/mod discomfort  Problem noted: Mild/Moderate discomfort Interventions (Mild/moderate discomfort): Comfort gels;Hand expression;Pre-pump if needed  Hold (Positioning): No assistance needed to correctly position infant at breast.  LATCH Score: 9  Lactation Tools Discussed/Used     Consult Status Consult Status: Complete    Hardie PulleyBerkelhammer, Nikayla Madaris Boschen 08/27/2014, 10:38 AM

## 2014-08-31 ENCOUNTER — Telehealth (HOSPITAL_COMMUNITY): Payer: Self-pay | Admitting: *Deleted

## 2014-08-31 NOTE — Telephone Encounter (Signed)
Preadmission screen  

## 2014-09-02 ENCOUNTER — Inpatient Hospital Stay (HOSPITAL_COMMUNITY): Admission: RE | Admit: 2014-09-02 | Payer: Medicaid Other | Source: Ambulatory Visit

## 2014-09-02 ENCOUNTER — Encounter (HOSPITAL_COMMUNITY): Payer: Self-pay | Admitting: *Deleted

## 2014-09-02 ENCOUNTER — Emergency Department (HOSPITAL_COMMUNITY)
Admission: EM | Admit: 2014-09-02 | Discharge: 2014-09-02 | Disposition: A | Discharge status: Home / Self Care | Payer: Medicaid Other | Attending: Emergency Medicine | Admitting: Emergency Medicine

## 2014-09-02 DIAGNOSIS — R05 Cough: Secondary | ICD-10-CM | POA: Diagnosis not present

## 2014-09-02 DIAGNOSIS — Z862 Personal history of diseases of the blood and blood-forming organs and certain disorders involving the immune mechanism: Secondary | ICD-10-CM | POA: Insufficient documentation

## 2014-09-02 DIAGNOSIS — I10 Essential (primary) hypertension: Secondary | ICD-10-CM | POA: Insufficient documentation

## 2014-09-02 DIAGNOSIS — H9202 Otalgia, left ear: Secondary | ICD-10-CM | POA: Insufficient documentation

## 2014-09-02 MED ORDER — ACETAMINOPHEN 500 MG PO TABS: 500.0000 mg | ORAL_TABLET | Freq: Four times a day (QID) | ORAL | Status: DC | PRN

## 2014-09-02 NOTE — Discharge Instructions (Signed)
Otalgia °The most common reason for this in children is an infection of the middle ear. Pain from the middle ear is usually caused by a build-up of fluid and pressure behind the eardrum. Pain from an earache can be sharp, dull, or burning. The pain may be temporary or constant. The middle ear is connected to the nasal passages by a short narrow tube called the Eustachian tube. The Eustachian tube allows fluid to drain out of the middle ear, and helps keep the pressure in your ear equalized. °CAUSES  °A cold or allergy can block the Eustachian tube with inflammation and the build-up of secretions. This is especially likely in small children, because their Eustachian tube is shorter and more horizontal. When the Eustachian tube closes, the normal flow of fluid from the middle ear is stopped. Fluid can accumulate and cause stuffiness, pain, hearing loss, and an ear infection if germs start growing in this area. °SYMPTOMS  °The symptoms of an ear infection may include fever, ear pain, fussiness, increased crying, and irritability. Many children will have temporary and minor hearing loss during and right after an ear infection. Permanent hearing loss is rare, but the risk increases the more infections a child has. Other causes of ear pain include retained water in the outer ear canal from swimming and bathing. °Ear pain in adults is less likely to be from an ear infection. Ear pain may be referred from other locations. Referred pain may be from the joint between your jaw and the skull. It may also come from a tooth problem or problems in the neck. Other causes of ear pain include: °· A foreign body in the ear. °· Outer ear infection. °· Sinus infections. °· Impacted ear wax. °· Ear injury. °· Arthritis of the jaw or TMJ problems. °· Middle ear infection. °· Tooth infections. °· Sore throat with pain to the ears. °DIAGNOSIS  °Your caregiver can usually make the diagnosis by examining you. Sometimes other special studies,  including x-rays and lab work may be necessary. °TREATMENT  °· If antibiotics were prescribed, use them as directed and finish them even if you or your child's symptoms seem to be improved. °· Sometimes PE tubes are needed in children. These are little plastic tubes which are put into the eardrum during a simple surgical procedure. They allow fluid to drain easier and allow the pressure in the middle ear to equalize. This helps relieve the ear pain caused by pressure changes. °HOME CARE INSTRUCTIONS  °· Only take over-the-counter or prescription medicines for pain, discomfort, or fever as directed by your caregiver. DO NOT GIVE CHILDREN ASPIRIN because of the association of Reye's Syndrome in children taking aspirin. °· Use a cold pack applied to the outer ear for 15-20 minutes, 03-04 times per day or as needed may reduce pain. Do not apply ice directly to the skin. You may cause frost bite. °· Over-the-counter ear drops used as directed may be effective. Your caregiver may sometimes prescribe ear drops. °· Resting in an upright position may help reduce pressure in the middle ear and relieve pain. °· Ear pain caused by rapidly descending from high altitudes can be relieved by swallowing or chewing gum. Allowing infants to suck on a bottle during airplane travel can help. °· Do not smoke in the house or near children. If you are unable to quit smoking, smoke outside. °· Control allergies. °SEEK IMMEDIATE MEDICAL CARE IF:  °· You or your child are becoming sicker. °· Pain or fever   relief is not obtained with medicine. °· You or your child's symptoms (pain, fever, or irritability) do not improve within 24 to 48 hours or as instructed. °· Severe pain suddenly stops hurting. This may indicate a ruptured eardrum. °· You or your children develop new problems such as severe headaches, stiff neck, difficulty swallowing, or swelling of the face or around the ear. °Document Released: 02/17/2004 Document Revised: 09/24/2011  Document Reviewed: 06/23/2008 °ExitCare® Patient Information ©2015 [employer]. This information is not intended to replace advice given to you by your health care provider. Make sure you discuss any questions you have with your health care provider. ° ° °Emergency Department Resource Guide °1) Find a Doctor and Pay Out of Pocket °Although you won't have to find out who is covered by your insurance plan, it is a good idea to ask around and get recommendations. You will then need to call the office and see if the doctor you have chosen will accept you as a new patient and what types of options they offer for patients who are self-pay. Some doctors offer discounts or will set up payment plans for their patients who do not have insurance, but you will need to ask so you aren't surprised when you get to your appointment. ° °2) Contact Your Local Health Department °Not all health departments have doctors that can see patients for sick visits, but many do, so it is worth a call to see if yours does. If you don't know where your local health department is, you can check in your phone book. The CDC also has a tool to help you locate your state's health department, and many state websites also have listings of all of their local health departments. ° °3) Find a Walk-in Clinic °If your illness is not likely to be very severe or complicated, you may want to try a walk in clinic. These are popping up all over the country in pharmacies, drugstores, and shopping centers. They're usually staffed by nurse practitioners or physician assistants that have been trained to treat common illnesses and complaints. They're usually fairly quick and inexpensive. However, if you have serious medical issues or chronic medical problems, these are probably not your best option. ° °No Primary Care Doctor: °- Call Health Connect at  [phone] - they can help you locate a primary care doctor that  accepts your insurance, provides certain services,  etc. °- Physician Referral Service- 1-800-533-3463 ° °Chronic Pain Problems: °Organization         Address  Phone   Notes  °[facility]  ([phone] Patients need to be referred by their primary care doctor.  ° °Medication Assistance: °Organization         Address  Phone   Notes  °[place] [facility] [address] °[place], [place] [address] [phone] --Must be a resident of [place] °-- Must have NO insurance coverage whatsoever (no Medicaid/ Medicare, etc.) °-- The pt. MUST have a primary care doctor that directs their care regularly and follows them in the community °  °[facility]  (866) 331-1348   °[employer]  (888) 892-1162   ° °Agencies that provide inexpensive medical care: °Organization         Address  Phone   Notes  °[facility]  ([phone]   °[facility]    ([phone]) [phone]   °[facility] [address] °[place], [place] [address] (336)   [facility] [address], [place] [phone]   Planned Parenthood    [phone]   [facility]    [phone]   [place] and [address], [place] Phone:  [phone], Fax:  [phone] Hours of Operation:  9 am - 6 pm, M-F.  Also accepts Medicaid/Medicare and self-pay.  [facility] [place], [address], [place] Phone: [phone], Fax: [phone]. Hours of Operation:  8:30 am - 5:30 pm, M-F.  Also accepts Medicaid and self-pay.  [facility] [place] [address] [place] Phone: [phone]   [place], [place], [place] [phone], Ext. [phone] Mondays & Thursdays: 7-9 AM.  First 15 patients are seen on a first come, first serve basis.    [place] Providers:  Organization         Address  Phone   Notes  [facility] [address], [place] [phone] Also accepts self-pay patients.  [facility] [address], [place]  [phone]   [place], [address], [place] [phone]   [facility] [address] [place] [phone]   Lucianne Lei [address], [place]   [phone] Only accepts [place] Access [place] patients after they have their name applied to their card.   Self-Pay (no insurance) in [facility]:  Organization         Address  Phone   Notes  Sickle Cell Patients, [facility] [place] [phone]   [facility] [place] [phone]   [facility] [address]  [place], [place],  [phone]   [facility]/Dr. Osei-Bonsu  [address] [place] or [address], [place] [phone] Phone number for both [place] and [place] locations is the same.  [facility]gent [facility] and [facility] [address] [place] [phone]   [facility] [address] [place] or [address] [phone]   [facility] [address], [place] [phone], phone; [phone], fax Sees patients 1st and 3rd Saturday of every month.  Must not qualify for public or private insurance (i.e. Medicaid, Medicare, [place] Health Choice, Veterans' Benefits)  Household income should be no more than 200% of the poverty level The clinic cannot treat you if you are pregnant or think you are pregnant  Sexually transmitted diseases are not treated at the clinic.    Dental Care: Organization         Address  Phone  Notes  [facility] [place] [phone] Accepts children up to  age 33 who are enrolled in [place] or [place]; pregnant women with a Medicaid card; and children who have applied for Medicaid or [place] Health Choice, but were declined, whose parents can pay a reduced fee at time of service.  [facility]  [address], [place] [phone] Accepts children up to age 47 who are enrolled in [place] or [place]; pregnant women with a  Medicaid card; and children who have applied for Medicaid or [place] Health Choice, but were declined, whose parents can pay a reduced fee at time of service.  [place] [facility]  [place] [phone] Patients are seen by appointment only. Walk-ins are not accepted. [place] will see patients 75 years of age and older. Monday - Tuesday (8am-5pm) Most Wednesdays (8:30-5pm) $30 per visit, cash only  [facility] Access [facility]  [address], [facility] [phone] Patients are seen by appointment only. Walk-ins are not accepted. Sisters will see patients 44 years of age and older. One Wednesday Evening (Monthly: Volunteer Based).  $30 per visit, cash only  [place]  [phone] for adults; Children under age 45, call Graduate Pediatric Dentistry at [phone]. Children aged 12-14, please call [phone] to request a pediatric application.  Dental services are provided in all areas of dental care including fillings, crowns and bridges, complete and partial dentures, implants, gum treatment, root canals, and extractions. Preventive care is also provided. Treatment is provided to both adults and children. Patients are selected via a lottery and there is often a waiting list.   [facility] [address] [place]  [phone] [email or web address]   [facility] [address] [place], [place] [phone], Ext. [phone] Second and Fourth Thursday of  each month, opens at 6:30 AM; Clinic ends at 9 AM.  Patients are seen on a first-come first-served basis, and a limited number are seen during each clinic.   [facility]  [address] Hillard Danker [place], [place] [phone]   Eligibility Requirements You must have lived in [place], [place], or [place] counties for at least the last three months.   You cannot be eligible for state or federal sponsored [employer], including [employer], [place], or [employer].   You generally cannot be eligible for healthcare insurance through your employer.    How to apply: Eligibility screenings are held every Tuesday and Wednesday afternoon from 1:00 pm until 4:00 pm. You do not need an appointment for the interview!  [facility] [address], [place], [place]   [place]  [address] Department  [place]  [phone]    Behavioral Health Resources in the Community: Intensive Outpatient Programs Organization         Address  Phone  Notes  [place]. [address] [place], [place] [phone]   [facility] Outpatient [address], [place], [place]   ADS: [facility] [address] [place], [place]   [place] [address]  [place], [place] or [phone]   Substance Abuse Resources Organization         Address  Phone  Notes  Alcohol and Drug Services  [phone]   [facility]  [phone]   [facility] [place]   Chinita Pester  [phone]   Residential & Outpatient Substance Abuse Program  7752221625   Psychological Services Organization         Address  Phone  Notes  [facility] [place]  [place]  [phone]   [place] [address],  [place] or [phone]    Mobile Crisis Teams Organization  Address  Phone  Notes  [facility], Mobile Crisis Care Unit  [phone]   [facility]  [address] [place], [place]   [facility] [address] [place] Time [phone]    Self-Help/Support Groups Organization         Address  Phone             Notes  [place]. of [place] - variety of support groups  [place] Call for more information  Narcotics Anonymous (NA), [facility] [address], [employer] [place]  2 meetings at this location   Special educational needs teacher         Address  Phone  Notes  [facility] [place],    [place]  [phone])[phone]   [facility]  [address], [place] [address], [place], [place]   [place], [place] [phone] Admissions: 8am-3pm M-F  Incentives Substance [place] [address],    [place], [place] [phone]   [facility] [address] [place], [place], [place]   [facility] [address],  [place], [place]   [facility] - Intensive Outpatient [address]een [address], [place], [place]   [facility] ([place] [place]  [place], [place] [phone] or [phone]   [facility] (RTS) [address] [place], [place] Accepts Medicaid  [facility] [place] [address],  [place] [phone] Substance Abuse/Addiction Treatment   [facility] Organization         Address  Phone  Notes  [facility]  [phone]   Domenic Schwab, PhD [address] [place]lis [place], [place]   [phone] or [phone]   [place], [place] [phone]     [facility] [address] [place], [place] [phone] Insurance/Medicaid/sponsorship through [facility] and Families [address], [address]                                    [place], [place] [phone] [place] [address]Presidential [place], [place] [phone]    Dr. Adele Schilder  [phone]   [facility] Dept. 1) [address], [place] 2) [place] 3)  [place] [address], [place] [phone]  [phone]   [place] [phone] or [phone] (After Hours)

## 2014-09-02 NOTE — ED Provider Notes (Signed)
CSN: [id number]     Arrival date & time 09/02/14  1016 History  This chart was scribed for non-physician practitioner Jinny SandersJoseph Masen Salvas, PA-C, working with Juliet RudeNathan R. Rubin PayorPickering, MD by Littie Deedsichard Sun, ED Scribe. This patient was seen in room TR10C/TR10C and the patient's care was started at 11:17 AM.     Chief Complaint  Patient presents with  . Otalgia   The history is provided by the patient. No language interpreter was used.   HPI Comments: Julie [name] is a 24 y.o. female who presents to the Emergency Department complaining of gradual onset, constant left ear pain that started this morning. Patient also reports having a cough for 1 week, tinnitus that started this morning. She notes a popping sound in her ear when yawning which provided mild relief of her pain. She denies fever, congestion, sinus pressure and rhinorrhea. Patient is breastfeeding.  Past Medical History  Diagnosis Date  . Medical history non-contributory   . Anemia   . Hypertension   . Pregnancy induced hypertension    Past Surgical History  Procedure Laterality Date  . No past surgeries     Family History  Problem Relation Age of Onset  . Diabetes Paternal Aunt    History  Substance Use Topics  . Smoking status: Never Smoker   . Smokeless tobacco: Not on file  . Alcohol Use: No   OB History    Gravida Para Term Preterm AB TAB SAB Ectopic Multiple Living   1 1 1       0 1     Review of Systems  Constitutional: Negative for fever.  HENT: Positive for ear pain and tinnitus. Negative for congestion, rhinorrhea and sinus pressure.   Eyes: Negative for visual disturbance.  Respiratory: Positive for cough.   Gastrointestinal: Negative for nausea and vomiting.  Neurological: Negative for dizziness, weakness, light-headedness and headaches.  Psychiatric/Behavioral: Negative.       Allergies  Review of patient's allergies indicates no known allergies.  Home Medications   Prior to Admission  medications   Medication Sig Start Date End Date Taking? Authorizing Provider  acetaminophen (TYLENOL) 500 MG tablet Take 1 tablet (500 mg total) by mouth every 6 (six) hours as needed. 09/02/14   Monte FantasiaJoseph W Atziry Baranski, PA-C   BP 143/88 mmHg  Pulse 103  Temp(Src) 98 F (36.7 C) (Oral)  Resp 15  SpO2 100%  LMP 11/16/2013  Breastfeeding? Yes Physical Exam  Constitutional: She is oriented to person, place, and time. She appears well-developed and well-nourished. No distress.  HENT:  Head: Normocephalic and atraumatic. Head is without abrasion and without contusion.  Right Ear: Tympanic membrane, external ear and ear canal normal. No drainage, swelling or tenderness. No mastoid tenderness. Tympanic membrane is not injected, not scarred, not perforated, not erythematous, not retracted and not bulging. Tympanic membrane mobility is normal. No middle ear effusion. No hemotympanum. No decreased hearing is noted.  Left Ear: Tympanic membrane, external ear and ear canal normal. No drainage, swelling or tenderness. No mastoid tenderness. Tympanic membrane is not injected, not scarred, not perforated, not erythematous, not retracted and not bulging. Tympanic membrane mobility is normal.  No middle ear effusion. No hemotympanum. No decreased hearing is noted.  Nose: Nose normal.  Mouth/Throat: Uvula is midline and oropharynx is clear and moist. No trismus in the jaw. No uvula swelling. No oropharyngeal exudate, posterior oropharyngeal edema, posterior oropharyngeal erythema or tonsillar abscesses.  Nasal turbinates non-erythematous and non-edematous. No tonsillar swelling.  Eyes: Pupils are equal,  round, and reactive to light.  Neck: Neck supple.  Cardiovascular: Normal rate.   Pulmonary/Chest: Effort normal.  Musculoskeletal: She exhibits no edema.  Neurological: She is alert and oriented to person, place, and time. No cranial nerve deficit.  Skin: Skin is warm and dry. No rash noted.  Psychiatric: She has a  normal mood and affect. Her behavior is normal.  Nursing note and vitals reviewed.   ED Course  Procedures  DIAGNOSTIC STUDIES: Oxygen Saturation is 100% on room air, normal by my interpretation.    COORDINATION OF CARE: 11:24 AM-Discussed treatment plan which includes medication with pt at bedside and pt agreed to plan.    Labs Review Labs Reviewed - No data to display  Imaging Review No results found.   EKG Interpretation None      MDM   Final diagnoses:  Otalgia of left ear    Patient here complaining of otalgia. Also complaining of dry cough for the past several weeks. No nasal congestion or sinus pressure. Ears unremarkable on exam bilaterally. No concern for otitis media based on exam. No concern for mastoiditis or malignant otitis externa. Patient stating her ear pain is relieved when yawning. I encouraged patient to use Tylenol for her otalgia, discussed return precautions with patient, and patient verbalizes understanding and agreement of this plan. I encouraged patient to call or return to the ER if any worsening of symptoms or should she have any questions or concerns.  I personally performed the services described in this documentation, which was scribed in my presence. The recorded information has been reviewed and is accurate.  BP 143/88 mmHg  Pulse 103  Temp(Src) 98 F (36.7 C) (Oral)  Resp 15  SpO2 100%  LMP 11/16/2013  Breastfeeding? Yes  Signed,  Ladona Mow, PA-C 2:33 PM    Monte Fantasia, PA-C 09/02/14 1433  Juliet Rude. Rubin Payor, MD 09/02/14 1606

## 2014-09-02 NOTE — ED Notes (Signed)
Pt reports Lt ear pain that started this AM.

## 2015-09-05 ENCOUNTER — Emergency Department (HOSPITAL_COMMUNITY): Payer: Medicaid Other

## 2015-09-05 ENCOUNTER — Emergency Department (HOSPITAL_COMMUNITY)
Admission: EM | Admit: 2015-09-05 | Discharge: 2015-09-05 | Disposition: A | Discharge status: Home / Self Care | Payer: Medicaid Other | Attending: Emergency Medicine | Admitting: Emergency Medicine

## 2015-09-05 ENCOUNTER — Encounter (HOSPITAL_COMMUNITY): Payer: Self-pay

## 2015-09-05 DIAGNOSIS — Z862 Personal history of diseases of the blood and blood-forming organs and certain disorders involving the immune mechanism: Secondary | ICD-10-CM | POA: Insufficient documentation

## 2015-09-05 DIAGNOSIS — I10 Essential (primary) hypertension: Secondary | ICD-10-CM | POA: Diagnosis not present

## 2015-09-05 DIAGNOSIS — R109 Unspecified abdominal pain: Secondary | ICD-10-CM | POA: Diagnosis present

## 2015-09-05 DIAGNOSIS — R1013 Epigastric pain: Secondary | ICD-10-CM | POA: Diagnosis not present

## 2015-09-05 DIAGNOSIS — Z3202 Encounter for pregnancy test, result negative: Secondary | ICD-10-CM | POA: Diagnosis not present

## 2015-09-05 DIAGNOSIS — R110 Nausea: Secondary | ICD-10-CM | POA: Insufficient documentation

## 2015-09-05 LAB — URINALYSIS, ROUTINE W REFLEX MICROSCOPIC
BILIRUBIN URINE: NEGATIVE
Glucose, UA: NEGATIVE mg/dL
Hgb urine dipstick: NEGATIVE
Ketones, ur: NEGATIVE mg/dL
Leukocytes, UA: NEGATIVE
Nitrite: NEGATIVE
PH: 6 (ref 5.0–8.0)
Protein, ur: NEGATIVE mg/dL
SPECIFIC GRAVITY, URINE: 1.023 (ref 1.005–1.030)

## 2015-09-05 LAB — WET PREP, GENITAL
Clue Cells Wet Prep HPF POC: NONE SEEN
Sperm: NONE SEEN
Trich, Wet Prep: NONE SEEN
Yeast Wet Prep HPF POC: NONE SEEN

## 2015-09-05 LAB — COMPREHENSIVE METABOLIC PANEL
ALBUMIN: 4.2 g/dL (ref 3.5–5.0)
ALK PHOS: 62 U/L (ref 38–126)
ALT: 12 U/L — ABNORMAL LOW (ref 14–54)
ANION GAP: 9 (ref 5–15)
AST: 16 U/L (ref 15–41)
BUN: 12 mg/dL (ref 6–20)
CALCIUM: 9.5 mg/dL (ref 8.9–10.3)
CO2: 23 mmol/L (ref 22–32)
Chloride: 109 mmol/L (ref 101–111)
Creatinine, Ser: 0.61 mg/dL (ref 0.44–1.00)
GFR calc Af Amer: >60 mL/min (ref >60)
GFR calc non Af Amer: >60 mL/min (ref >60)
Glucose, Bld: 95 mg/dL (ref 65–99)
Potassium: 4.1 mmol/L (ref 3.5–5.1)
Sodium: 141 mmol/L (ref 135–145)
Total Bilirubin: 0.6 mg/dL (ref 0.3–1.2)
Total Protein: 7.9 g/dL (ref 6.5–8.1)

## 2015-09-05 LAB — CBC
HCT: 36.4 % (ref 36.0–46.0)
HEMOGLOBIN: 11.6 g/dL — AB (ref 12.0–15.0)
MCH: 23.5 pg — ABNORMAL LOW (ref 26.0–34.0)
MCHC: 31.9 g/dL (ref 30.0–36.0)
MCV: 73.8 fL — ABNORMAL LOW (ref 78.0–100.0)
Platelets: 281 10*3/uL (ref 150–400)
RBC: 4.93 MIL/uL (ref 3.87–5.11)
RDW: 14.7 % (ref 11.5–15.5)
WBC: 6.1 10*3/uL (ref 4.0–10.5)

## 2015-09-05 LAB — POC URINE PREG, ED: Preg Test, Ur: NEGATIVE

## 2015-09-05 LAB — LIPASE, BLOOD: Lipase: 25 U/L (ref 11–51)

## 2015-09-05 NOTE — ED Notes (Signed)
Patient desires only female attendants. Does NOT want female in room. Dr. Rosalia Hammers at bedside. Patient NAD. Ambulatory with steady gait

## 2015-09-05 NOTE — Discharge Instructions (Signed)

## 2015-09-05 NOTE — ED Notes (Signed)
Patient here with abdominal cramping x 3 days. No nausea, no vomiting, no diarrhea. No menstrual cycle because patient breast feeding

## 2015-09-05 NOTE — ED Provider Notes (Signed)
CSN: [id number]     Arrival date & time 09/05/15  8657 History   First MD Initiated Contact with Patient 09/05/15 0920     Chief Complaint  Patient presents with  . Abdominal Pain     (Consider location/radiation/quality/duration/timing/severity/associated sxs/prior Treatment) HPI  25 year old female G1 P1 currently states that she has not had a menstrual cycle since birth of her child a year ago and breast-feeding. Presents today complaining of abdominal pain which she describes as sharp and intermittent. She has had it intermittently for 3 days. It began in the early morning hours which caused her to come in. She describes it as sharp and radiating out the middle of her abdomen. She states that food and eating make it worse. She has had some nausea but no vomiting or diarrhea. She has not had any fever. She's had no similar symptoms in the past. She has not done anything to intervene in the pain as it has gone away prior to her attempting any intervention. She has not had similar symptoms in the past. She does not know she is currently pregnant.  Past Medical History  Diagnosis Date  . Medical history non-contributory   . Anemia   . Hypertension   . Pregnancy induced hypertension    Past Surgical History  Procedure Laterality Date  . No past surgeries     Family History  Problem Relation Age of Onset  . Diabetes Paternal Aunt    Social History  Substance Use Topics  . Smoking status: Never Smoker   . Smokeless tobacco: None  . Alcohol Use: No   OB History    Gravida Para Term Preterm AB TAB SAB Ectopic Multiple Living   0 1     Review of Systems  All other systems reviewed and are negative.     Allergies  Review of patient's allergies indicates no known allergies.  Home Medications   Prior to Admission medications   Not on File   BP 115/82 mmHg  Pulse 89  Temp(Src) 98.2 F (36.8 C)  Resp 16  Ht  (1.575 m)  Wt 77.111 kg  BMI 31.09 kg/m2   SpO2 97% Physical Exam  Constitutional: She is oriented to person, place, and time. She appears well-developed and well-nourished.  HENT:  Head: Normocephalic and atraumatic.  Right Ear: External ear normal.  Left Ear: External ear normal.  Nose: Nose normal.  Mouth/Throat: Oropharynx is clear and moist.  Eyes: Conjunctivae and EOM are normal. Pupils are equal, round, and reactive to light.  Neck: Normal range of motion. Neck supple. No JVD present. No tracheal deviation present. No thyromegaly present.  Cardiovascular: Normal rate, regular rhythm, normal heart sounds and intact distal pulses.   Pulmonary/Chest: Effort normal and breath sounds normal. She has no wheezes.  Abdominal: Soft. Bowel sounds are normal. She exhibits no mass. There is tenderness. There is no guarding.    Musculoskeletal: Normal range of motion.  Lymphadenopathy:    She has no cervical adenopathy.  Neurological: She is alert and oriented to person, place, and time. She has normal reflexes. No cranial nerve deficit or sensory deficit. Gait normal. GCS eye subscore is 4. GCS verbal subscore is 5. GCS motor subscore is 6.  Reflex Scores:      Bicep reflexes are 2+ on the right side and 2+ on the left side.      Patellar reflexes are 2+ on the right side and 2+  on the left side. Strength is normal and equal throughout. Cranial nerves grossly intact. Patient fluent. No gross ataxia and patient able to ambulate without difficulty.  Skin: Skin is warm and dry.  Psychiatric: She has a normal mood and affect. Her behavior is normal. Judgment and thought content normal.  Nursing note and vitals reviewed.   ED Course  Procedures (including critical care time) Labs Review Labs Reviewed  COMPREHENSIVE METABOLIC PANEL - Abnormal; Notable for the following:    ALT 12 (*)    All other components within normal limits  CBC - Abnormal; Notable for the following:    Hemoglobin 11.6 (*)    MCV 73.8 (*)    MCH 23.5 (*)      All other components within normal limits  WET PREP, GENITAL  LIPASE, BLOOD  URINALYSIS, ROUTINE W REFLEX MICROSCOPIC (NOT AT [facility])  POC URINE PREG, ED  GC/CHLAMYDIA PROBE AMP ([place]) NOT AT [facility]    Imaging Review US Abdomen Complete  09/05/2015  CLINICAL DATA:  Epigastric abdominal pain for 3 days. EXAM: ABDOMEN ULTRASOUND COMPLETE COMPARISON:  None. FINDINGS: Gallbladder: No gallstones or wall thickening visualized. No sonographic Murphy sign noted by sonographer. Common bile duct: Diameter: 3 mm, within normal limits. Liver: No focal lesion identified. Within normal limits in parenchymal echogenicity. IVC: No abnormality visualized. Pancreas: Visualized portion unremarkable. Spleen: Size and appearance within normal limits. Right Kidney: Length: 10.9 cm. Echogenicity within normal limits. No mass or hydronephrosis visualized. Left Kidney: Length: 10.6 cm. Echogenicity within normal limits. No mass or hydronephrosis visualized. Abdominal aorta: No aneurysm visualized. Other findings: None. IMPRESSION: Negative abdomen ultrasound. Electronically Signed   By: Myles Rosenthal M.D.   On: 09/05/2015 11:48   I have personally reviewed and evaluated these images and lab results as part of my medical decision-making.   EKG Interpretation None      MDM   Final diagnoses:  Epigastric pain    25 year old female is here complaining of intermittent abdominal pain with normal workup here. She has no evidence of pancreatitis, hepatitis, and ultrasound of the abdomen is normal. Her abdomen is soft with the exception of some mild tenderness in the epigastrium. Her symptoms have essentially resolved here. I have discussed return precautions and need for follow-up with the patient she voices understanding. We also discussed need for birth control while breast-feeding as she could not rely on breast-feeding for birth control.    Margarita Grizzle, MD 09/05/15 [phone]

## 2015-09-06 LAB — GC/CHLAMYDIA PROBE AMP (~~LOC~~) NOT AT ARMC
Chlamydia: NEGATIVE
Neisseria Gonorrhea: NEGATIVE

## 2017-01-18 ENCOUNTER — Encounter (HOSPITAL_COMMUNITY): Payer: Self-pay | Admitting: *Deleted

## 2017-01-18 ENCOUNTER — Inpatient Hospital Stay (HOSPITAL_COMMUNITY)
Admission: AD | Admit: 2017-01-18 | Discharge: 2017-01-18 | Disposition: A | Discharge status: Home / Self Care | Payer: BLUE CROSS/BLUE SHIELD | Source: Ambulatory Visit | Attending: Obstetrics and Gynecology | Admitting: Obstetrics and Gynecology

## 2017-01-18 DIAGNOSIS — Z3A10 10 weeks gestation of pregnancy: Secondary | ICD-10-CM | POA: Diagnosis not present

## 2017-01-18 DIAGNOSIS — Z3491 Encounter for supervision of normal pregnancy, unspecified, first trimester: Secondary | ICD-10-CM

## 2017-01-18 DIAGNOSIS — O210 Mild hyperemesis gravidarum: Secondary | ICD-10-CM | POA: Diagnosis present

## 2017-01-18 DIAGNOSIS — O219 Vomiting of pregnancy, unspecified: Secondary | ICD-10-CM | POA: Diagnosis not present

## 2017-01-18 LAB — URINALYSIS, ROUTINE W REFLEX MICROSCOPIC
Bilirubin Urine: NEGATIVE
Glucose, UA: NEGATIVE mg/dL
Hgb urine dipstick: NEGATIVE
Ketones, ur: NEGATIVE mg/dL
LEUKOCYTES UA: NEGATIVE
NITRITE: NEGATIVE
PH: 7 (ref 5.0–8.0)
Protein, ur: NEGATIVE mg/dL
Specific Gravity, Urine: 1.005 (ref 1.005–1.030)

## 2017-01-18 LAB — POCT PREGNANCY, URINE: PREG TEST UR: POSITIVE — AB

## 2017-01-18 MED ORDER — PROMETHAZINE HCL 25 MG PO TABS: 25.0000 mg | ORAL_TABLET | Freq: Four times a day (QID) | ORAL | 0 refills | Status: DC | PRN

## 2017-01-18 MED ORDER — ONDANSETRON 8 MG PO TBDP
8.0000 mg | ORAL_TABLET | Freq: Once | ORAL | Status: AC
  Administered 2017-01-18: 8 mg via ORAL
  Filled 2017-01-18: qty 1

## 2017-01-18 MED ORDER — ONDANSETRON 4 MG PO TBDP: 4.0000 mg | ORAL_TABLET | Freq: Three times a day (TID) | ORAL | 0 refills | Status: DC | PRN

## 2017-01-18 NOTE — MAU Provider Note (Signed)
History     CSN: [id number]  Arrival date and time: 01/18/17 1142  First Provider Initiated Contact with Patient 01/18/17 1313      Chief Complaint  Patient presents with  . Morning Sickness   HPI Julie [name] is a 26 y.o. G2P1001 at 3097w3d by LMP who presents with nausea & vomiting. Reports continued nausea since finding out she was pregnant. Has vomited twice today & reports continued nausea. Occasional mild lower abdominal cramping; currently no pain. Denies fever/chills, vaginal bleeding, dysuria, diarrhea, or constipation. Returned from [place] this week. Has not started prenatal care. Previously used [facility] for prenatal care in last pregnancy.  OB History    Gravida Para Term Preterm AB Living   2 1 1     1    SAB TAB Ectopic Multiple Live Births         0 1      Past Medical History:  Diagnosis Date  . Anemia   . Pregnancy induced hypertension     Past Surgical History:  Procedure Laterality Date  . NO PAST SURGERIES      Family History  Problem Relation Age of Onset  . Diabetes Paternal Aunt     Social History  Substance Use Topics  . Smoking status: Never Smoker  . Smokeless tobacco: Never Used  . Alcohol use No    Allergies: No Known Allergies  No prescriptions prior to admission.    Review of Systems  Constitutional: Negative.   Gastrointestinal: Positive for nausea and vomiting. Negative for abdominal pain, constipation and diarrhea.  Genitourinary: Negative.    Physical Exam   Blood pressure 118/71, pulse 81, temperature 98.3 F (36.8 C), temperature source Oral, resp. rate 18, height 5\' 2"  (1.575 m), weight 178 lb 4 oz (80.9 kg), last menstrual period 11/06/2016, SpO2 100 %, currently breastfeeding.  Physical Exam  Nursing note and vitals reviewed. Constitutional: She is oriented to person, place, and time. She appears well-developed and well-nourished. No distress.  HENT:  Head: Normocephalic and atraumatic.  Eyes: Conjunctivae  are normal. Right eye exhibits no discharge. Left eye exhibits no discharge. No scleral icterus.  Neck: Normal range of motion.  Cardiovascular: Normal rate, regular rhythm and normal heart sounds.   No murmur heard. Respiratory: Effort normal and breath sounds normal. No respiratory distress. She has no wheezes.  GI: Soft. Bowel sounds are normal. She exhibits no distension. There is no tenderness. There is no rebound and no guarding.  Neurological: She is alert and oriented to person, place, and time.  Skin: Skin is warm and dry. She is not diaphoretic.  Psychiatric: She has a normal mood and affect. Her behavior is normal. Judgment and thought content normal.    MAU Course  Procedures Results for orders placed or performed during the hospital encounter of 01/18/17 (from the past 24 hour(s))  Urinalysis, Routine w reflex microscopic     Status: Abnormal   Collection Time: 01/18/17 12:17 PM  Result Value Ref Range   Color, Urine STRAW (A) YELLOW   APPearance CLEAR CLEAR   Specific Gravity, Urine 1.005 1.005 - 1.030   pH 7.0 5.0 - 8.0   Glucose, UA NEGATIVE NEGATIVE mg/dL   Hgb urine dipstick NEGATIVE NEGATIVE   Bilirubin Urine NEGATIVE NEGATIVE   Ketones, ur NEGATIVE NEGATIVE mg/dL   Protein, ur NEGATIVE NEGATIVE mg/dL   Nitrite NEGATIVE NEGATIVE   Leukocytes, UA NEGATIVE NEGATIVE  Pregnancy, urine POC     Status: Abnormal   Collection Time:  01/18/17 12:32 PM  Result Value Ref Range   Preg Test, Ur POSITIVE (A) NEGATIVE    MDM + UPT No vomiting in MAU & U/a doesn't indicate dehydration. Will given ODT zofran in MAU Informal bedside ultrasound shows IUP w/CRL of [redacted]w[redacted]d & FHR 169 Assessment and Plan  A; 1. Nausea and vomiting during pregnancy prior to [redacted] weeks gestation   2. Fetal heart tones present, first trimester    P; Discharge home Rx phenergan & zofran Discussed reasons to return to MAU Schedule prenatal care with [facility]  Judeth Horn 01/18/2017, 1:13 PM

## 2017-01-18 NOTE — Discharge Instructions (Signed)
Morning Sickness °Morning sickness is when you feel sick to your stomach (nauseous) during pregnancy. This nauseous feeling may or may not come with vomiting. It often occurs in the morning but can be a problem any time of day. Morning sickness is most common during the first trimester, but it may continue throughout pregnancy. While morning sickness is unpleasant, it is usually harmless unless you develop severe and continual vomiting (hyperemesis gravidarum). This condition requires more intense treatment. °What are the causes? °The cause of morning sickness is not completely known but seems to be related to normal hormonal changes that occur in pregnancy. °What increases the risk? °You are at greater risk if you: °· Experienced nausea or vomiting before your pregnancy. °· Had morning sickness during a previous pregnancy. °· Are pregnant with more than one baby, such as twins. ° °How is this treated? °Do not use any medicines (prescription, over-the-counter, or herbal) for morning sickness without first talking to your health care provider. Your health care provider may prescribe or recommend: °· Vitamin B6 supplements. °· Anti-nausea medicines. °· The herbal medicine ginger. ° °Follow these instructions at home: °· Only take over-the-counter or prescription medicines as directed by your health care provider. °· Taking multivitamins before getting pregnant can prevent or decrease the severity of morning sickness in most women. °· Eat a piece of dry toast or unsalted crackers before getting out of bed in the morning. °· Eat five or six small meals a day. °· Eat dry and bland foods (rice, baked potato). Foods high in carbohydrates are often helpful. °· Do not drink liquids with your meals. Drink liquids between meals. °· Avoid greasy, fatty, and spicy foods. °· Get someone to cook for you if the smell of any food causes nausea and vomiting. °· If you feel nauseous after taking prenatal vitamins, take the vitamins at  night or with a snack. °· Snack on protein foods (nuts, yogurt, cheese) between meals if you are hungry. °· Eat unsweetened gelatins for desserts. °· Wearing an acupressure wristband (worn for sea sickness) may be helpful. °· Acupuncture may be helpful. °· Do not smoke. °· Get a humidifier to keep the air in your house free of odors. °· Get plenty of fresh air. °Contact a health care provider if: °· Your home remedies are not working, and you need medicine. °· You feel dizzy or lightheaded. °· You are losing weight. °Get help right away if: °· You have persistent and uncontrolled nausea and vomiting. °· You pass out (faint). °This information is not intended to replace advice given to you by your health care provider. Make sure you discuss any questions you have with your health care provider. °Document Released: 08/23/2006 Document Revised: 12/08/2015 Document Reviewed: 12/17/2012 °Elsevier Interactive Patient Education © 2017 Elsevier Inc. ° °

## 2017-01-18 NOTE — MAU Note (Signed)
Not feeling well, unable to eat, nauseated/vomiting.  Started about a month ago; twice this morning.  .  Denies bleeding, some cramping. Just returned from [place] on the 7/3

## 2017-01-21 ENCOUNTER — Inpatient Hospital Stay (HOSPITAL_COMMUNITY)
Admission: AD | Admit: 2017-01-21 | Discharge: 2017-01-21 | Disposition: A | Discharge status: Home / Self Care | Payer: BLUE CROSS/BLUE SHIELD | Source: Ambulatory Visit | Attending: Family Medicine | Admitting: Family Medicine

## 2017-01-21 ENCOUNTER — Encounter (HOSPITAL_COMMUNITY): Payer: Self-pay | Admitting: *Deleted

## 2017-01-21 DIAGNOSIS — R109 Unspecified abdominal pain: Secondary | ICD-10-CM | POA: Insufficient documentation

## 2017-01-21 DIAGNOSIS — N939 Abnormal uterine and vaginal bleeding, unspecified: Secondary | ICD-10-CM | POA: Diagnosis present

## 2017-01-21 DIAGNOSIS — Z3A10 10 weeks gestation of pregnancy: Secondary | ICD-10-CM | POA: Insufficient documentation

## 2017-01-21 DIAGNOSIS — M549 Dorsalgia, unspecified: Secondary | ICD-10-CM | POA: Insufficient documentation

## 2017-01-21 DIAGNOSIS — N930 Postcoital and contact bleeding: Secondary | ICD-10-CM | POA: Diagnosis not present

## 2017-01-21 DIAGNOSIS — O26891 Other specified pregnancy related conditions, first trimester: Secondary | ICD-10-CM | POA: Insufficient documentation

## 2017-01-21 DIAGNOSIS — O208 Other hemorrhage in early pregnancy: Secondary | ICD-10-CM | POA: Insufficient documentation

## 2017-01-21 DIAGNOSIS — Z3491 Encounter for supervision of normal pregnancy, unspecified, first trimester: Secondary | ICD-10-CM

## 2017-01-21 LAB — URINALYSIS, ROUTINE W REFLEX MICROSCOPIC
BILIRUBIN URINE: NEGATIVE
Bacteria, UA: NONE SEEN
Glucose, UA: NEGATIVE mg/dL
Ketones, ur: 5 mg/dL — AB
Leukocytes, UA: NEGATIVE
Nitrite: NEGATIVE
Protein, ur: 100 mg/dL — AB
Specific Gravity, Urine: 1.03 (ref 1.005–1.030)
pH: 5 (ref 5.0–8.0)

## 2017-01-21 NOTE — MAU Provider Note (Signed)
History     CSN: [id number]  Arrival date and time: 01/21/17 4098   First Provider Initiated Contact with Patient 01/21/17 [phone]      Chief Complaint  Patient presents with  . Vaginal Bleeding  . Abdominal Pain  . Back Pain   HPI   [name] is a 26 y.o. female G2P1001 @ [redacted]w[redacted]d here in MAU with new onset bleeding. States she woke up this morning and noted a "deep red blood" when she wiped. Last intercourse was yesterday. No bleeding noted immediately following. States she has never had bleeding in this pregnancy.   OB History    Gravida Para Term Preterm AB Living   2 1 1     1    SAB TAB Ectopic Multiple Live Births         0 1      Past Medical History:  Diagnosis Date  . Anemia   . Pregnancy induced hypertension     Past Surgical History:  Procedure Laterality Date  . NO PAST SURGERIES      Family History  Problem Relation Age of Onset  . Diabetes Paternal Aunt     Social History  Substance Use Topics  . Smoking status: Never Smoker  . Smokeless tobacco: Never Used  . Alcohol use No    Allergies: No Known Allergies  Prescriptions Prior to Admission  Medication Sig Dispense Refill Last Dose  . ondansetron (ZOFRAN ODT) 4 MG disintegrating tablet Take 1 tablet (4 mg total) by mouth every 8 (eight) hours as needed for nausea or vomiting. 15 tablet 0   . promethazine (PHENERGAN) 25 MG tablet Take 1 tablet (25 mg total) by mouth every 6 (six) hours as needed for nausea or vomiting. 30 tablet 0    Results for orders placed or performed during the hospital encounter of 01/21/17 (from the past 48 hour(s))  Urinalysis, Routine w reflex microscopic     Status: Abnormal   Collection Time: 01/21/17  7:55 AM  Result Value Ref Range   Color, Urine YELLOW YELLOW   APPearance CLEAR CLEAR   Specific Gravity, Urine 1.030 1.005 - 1.030   pH 5.0 5.0 - 8.0   Glucose, UA NEGATIVE NEGATIVE mg/dL   Hgb urine dipstick LARGE (A) NEGATIVE   Bilirubin Urine  NEGATIVE NEGATIVE   Ketones, ur 5 (A) NEGATIVE mg/dL   Protein, ur 478 (A) NEGATIVE mg/dL   Nitrite NEGATIVE NEGATIVE   Leukocytes, UA NEGATIVE NEGATIVE   RBC / HPF TOO NUMEROUS TO COUNT 0 - 5 RBC/hpf   WBC, UA 0-5 0 - 5 WBC/hpf   Bacteria, UA NONE SEEN NONE SEEN   Squamous Epithelial / LPF 0-5 (A) NONE SEEN   Mucous PRESENT     Review of Systems  Gastrointestinal: Positive for abdominal pain.  Genitourinary: Positive for vaginal bleeding. Negative for dysuria.  Musculoskeletal: Positive for back pain.   Physical Exam   Blood pressure 112/62, pulse 80, temperature 98.1 F (36.7 C), temperature source Oral, resp. rate 18, last menstrual period 11/06/2016, currently breastfeeding.  Physical Exam  Constitutional: She is oriented to person, place, and time. She appears well-developed and well-nourished. No distress.  HENT:  Head: Normocephalic.  GI: Soft. She exhibits no distension. There is no tenderness. There is no rebound.  Genitourinary:  Genitourinary Comments: Vagina - Small amount of dark red blood in the vagina, + mucus,  no odor  Cervix - No contact bleeding, no active bleeding  Bimanual exam: Cervix closed, posterior  Uterus non tender, enlarged  Chaperone present for exam.   Musculoskeletal: Normal range of motion.  Neurological: She is alert and oriented to person, place, and time.  Skin: Skin is warm. She is not diaphoretic.  Psychiatric: Her behavior is normal.    MAU Course  Procedures  None  MDM  O positive blood type.  + fetal hear tones via doppler.   Assessment and Plan   A:  1. PCB (post coital bleeding)   2. Abdominal pain during pregnancy in first trimester   3. Presence of fetal heart sounds in first trimester     P:  Discharge home in stable condition Pelvic rest Bleeding precautions Return to MAU if symptoms worsen Start prenatal care  Terrilyn Tyner, Harolyn RutherfordJennifer I, NP 01/21/2017 12:17 PM

## 2017-01-21 NOTE — MAU Note (Signed)
C/o vaginal bleeding since 0640; c/o cramping and back pain that started with the bleeding;

## 2017-01-21 NOTE — Discharge Instructions (Signed)
Vaginal Bleeding During Pregnancy, First Trimester °A small amount of bleeding (spotting) from the vagina is common in early pregnancy. Sometimes the bleeding is normal and is not a problem, and sometimes it is a sign of something serious. Be sure to tell your doctor about any bleeding from your vagina right away. °Follow these instructions at home: °· Watch your condition for any changes. °· Follow your doctor's instructions about how active you can be. °· If you are on bed rest: °? You may need to stay in bed and only get up to use the bathroom. °? You may be allowed to do some activities. °? If you need help, make plans for someone to help you. °· Write down: °? The number of pads you use each day. °? How often you change pads. °? How soaked (saturated) your pads are. °· Do not use tampons. °· Do not douche. °· Do not have sex or orgasms until your doctor says it is okay. °· If you pass any tissue from your vagina, save the tissue so you can show it to your doctor. °· Only take medicines as told by your doctor. °· Do not take aspirin because it can make you bleed. °· Keep all follow-up visits as told by your doctor. °Contact a doctor if: °· You bleed from your vagina. °· You have cramps. °· You have labor pains. °· You have a fever that does not go away after you take medicine. °Get help right away if: °· You have very bad cramps in your back or belly (abdomen). °· You pass large clots or tissue from your vagina. °· You bleed more. °· You feel light-headed or weak. °· You pass out (faint). °· You have chills. °· You are leaking fluid or have a gush of fluid from your vagina. °· You pass out while pooping (having a bowel movement). °This information is not intended to replace advice given to you by your health care provider. Make sure you discuss any questions you have with your health care provider. °Document Released: 11/16/2013 Document Revised: 12/08/2015 Document Reviewed: 03/09/2013 °Elsevier Interactive  Patient Education © 2018 Elsevier Inc. ° °

## 2017-01-30 ENCOUNTER — Other Ambulatory Visit (HOSPITAL_COMMUNITY): Payer: Self-pay | Admitting: Nurse Practitioner

## 2017-01-30 DIAGNOSIS — Z3A13 13 weeks gestation of pregnancy: Secondary | ICD-10-CM

## 2017-01-30 DIAGNOSIS — Z3682 Encounter for antenatal screening for nuchal translucency: Secondary | ICD-10-CM

## 2017-02-06 ENCOUNTER — Ambulatory Visit (HOSPITAL_COMMUNITY)
Admission: RE | Admit: 2017-02-06 | Discharge: 2017-02-06 | Disposition: A | Discharge status: Home / Self Care | Payer: BLUE CROSS/BLUE SHIELD | Source: Ambulatory Visit | Attending: Nurse Practitioner | Admitting: Nurse Practitioner

## 2017-02-06 ENCOUNTER — Encounter (HOSPITAL_COMMUNITY): Payer: Self-pay

## 2017-02-06 DIAGNOSIS — Z3A13 13 weeks gestation of pregnancy: Secondary | ICD-10-CM | POA: Insufficient documentation

## 2017-02-06 DIAGNOSIS — Z3682 Encounter for antenatal screening for nuchal translucency: Secondary | ICD-10-CM | POA: Insufficient documentation

## 2017-02-11 ENCOUNTER — Other Ambulatory Visit: Payer: Self-pay

## 2017-02-11 ENCOUNTER — Encounter (HOSPITAL_COMMUNITY): Payer: Self-pay

## 2017-02-15 ENCOUNTER — Other Ambulatory Visit (HOSPITAL_COMMUNITY): Payer: Self-pay | Admitting: Nurse Practitioner

## 2017-02-15 DIAGNOSIS — Z3689 Encounter for other specified antenatal screening: Secondary | ICD-10-CM

## 2017-03-21 ENCOUNTER — Ambulatory Visit (HOSPITAL_COMMUNITY)
Admission: RE | Admit: 2017-03-21 | Discharge: 2017-03-21 | Disposition: A | Discharge status: Home / Self Care | Payer: Medicaid Other | Source: Ambulatory Visit | Attending: Nurse Practitioner | Admitting: Nurse Practitioner

## 2017-03-21 ENCOUNTER — Encounter (HOSPITAL_COMMUNITY): Payer: Self-pay

## 2017-03-21 ENCOUNTER — Other Ambulatory Visit (HOSPITAL_COMMUNITY): Payer: Self-pay | Admitting: Nurse Practitioner

## 2017-03-21 DIAGNOSIS — Z3A19 19 weeks gestation of pregnancy: Secondary | ICD-10-CM

## 2017-03-21 DIAGNOSIS — O09892 Supervision of other high risk pregnancies, second trimester: Secondary | ICD-10-CM

## 2017-03-21 DIAGNOSIS — O99212 Obesity complicating pregnancy, second trimester: Secondary | ICD-10-CM | POA: Insufficient documentation

## 2017-03-21 DIAGNOSIS — O09292 Supervision of pregnancy with other poor reproductive or obstetric history, second trimester: Secondary | ICD-10-CM | POA: Insufficient documentation

## 2017-03-21 DIAGNOSIS — Z8679 Personal history of other diseases of the circulatory system: Secondary | ICD-10-CM | POA: Insufficient documentation

## 2017-03-21 DIAGNOSIS — Z3689 Encounter for other specified antenatal screening: Secondary | ICD-10-CM | POA: Insufficient documentation

## 2017-07-16 NOTE — L&D Delivery Note (Signed)
Delivery Note At 10:37 AM a viable female was delivered via Vaginal, Spontaneous (Presentation: vertex ;OA  ).  APGAR: pending weight  Pending  Placenta status:deliverd intact  , .  Cord:3V  with the following complications:none  .  Cord pH: none  Anesthesia:  Epidural  Episiotomy: None Lacerations: None Suture Repair: none  Est. Blood Loss (mL): 100  Mom to postpartum.  Baby to Couplet care / Skin to Skin.  Mackayla Mullins 08/15/2017, 10:55 AM

## 2017-08-15 ENCOUNTER — Inpatient Hospital Stay (HOSPITAL_COMMUNITY): Payer: Medicaid Other | Admitting: Anesthesiology

## 2017-08-15 ENCOUNTER — Encounter (HOSPITAL_COMMUNITY): Payer: Self-pay

## 2017-08-15 ENCOUNTER — Other Ambulatory Visit: Payer: Self-pay

## 2017-08-15 ENCOUNTER — Other Ambulatory Visit: Payer: Self-pay | Admitting: Family Medicine

## 2017-08-15 ENCOUNTER — Inpatient Hospital Stay (HOSPITAL_COMMUNITY)
Admission: AD | Admit: 2017-08-15 | Discharge: 2017-08-16 | DRG: 807 | Disposition: A | Discharge status: Home / Self Care | Payer: Medicaid Other | Source: Ambulatory Visit | Attending: Obstetrics & Gynecology | Admitting: Obstetrics & Gynecology

## 2017-08-15 DIAGNOSIS — O99824 Streptococcus B carrier state complicating childbirth: Secondary | ICD-10-CM | POA: Diagnosis present

## 2017-08-15 DIAGNOSIS — O9902 Anemia complicating childbirth: Secondary | ICD-10-CM | POA: Diagnosis present

## 2017-08-15 DIAGNOSIS — Z3483 Encounter for supervision of other normal pregnancy, third trimester: Secondary | ICD-10-CM | POA: Diagnosis present

## 2017-08-15 DIAGNOSIS — Z3A40 40 weeks gestation of pregnancy: Secondary | ICD-10-CM

## 2017-08-15 DIAGNOSIS — D649 Anemia, unspecified: Secondary | ICD-10-CM | POA: Diagnosis present

## 2017-08-15 LAB — CBC
HCT: 32.8 % — ABNORMAL LOW (ref 36.0–46.0)
HEMOGLOBIN: 10.6 g/dL — AB (ref 12.0–15.0)
MCH: 24.1 pg — ABNORMAL LOW (ref 26.0–34.0)
MCHC: 32.3 g/dL (ref 30.0–36.0)
MCV: 74.7 fL — ABNORMAL LOW (ref 78.0–100.0)
Platelets: 232 10*3/uL (ref 150–400)
RBC: 4.39 MIL/uL (ref 3.87–5.11)
RDW: 15.6 % — AB (ref 11.5–15.5)
WBC: 7.1 10*3/uL (ref 4.0–10.5)

## 2017-08-15 LAB — TYPE AND SCREEN
ABO/RH(D): O POS
ANTIBODY SCREEN: NEGATIVE

## 2017-08-15 LAB — RPR: RPR Ser Ql: NONREACTIVE

## 2017-08-15 MED ORDER — LACTATED RINGERS IV SOLN
500.0000 mL | INTRAVENOUS | Status: DC | PRN
  Administered 2017-08-15 (×2): 500 mL via INTRAVENOUS

## 2017-08-15 MED ORDER — FLEET ENEMA 7-19 GM/118ML RE ENEM: 1.0000 | ENEMA | RECTAL | Status: DC | PRN

## 2017-08-15 MED ORDER — EPHEDRINE 5 MG/ML INJ
10.0000 mg | INTRAVENOUS | Status: DC | PRN
  Filled 2017-08-15: qty 2

## 2017-08-15 MED ORDER — OXYTOCIN BOLUS FROM INFUSION
500.0000 mL | Freq: Once | INTRAVENOUS | Status: AC
  Administered 2017-08-15: 500 mL via INTRAVENOUS

## 2017-08-15 MED ORDER — LACTATED RINGERS IV SOLN
500.0000 mL | Freq: Once | INTRAVENOUS | Status: AC
  Administered 2017-08-15: 500 mL via INTRAVENOUS

## 2017-08-15 MED ORDER — PENICILLIN G POTASSIUM 5000000 UNITS IJ SOLR
5.0000 10*6.[IU] | Freq: Once | INTRAVENOUS | Status: AC
  Administered 2017-08-15: 5 10*6.[IU] via INTRAVENOUS
  Filled 2017-08-15: qty 5

## 2017-08-15 MED ORDER — LACTATED RINGERS IV SOLN
INTRAVENOUS | Status: DC
  Administered 2017-08-15 (×2): via INTRAVENOUS

## 2017-08-15 MED ORDER — ONDANSETRON HCL 4 MG/2ML IJ SOLN: 4.0000 mg | INTRAMUSCULAR | Status: DC | PRN

## 2017-08-15 MED ORDER — ACETAMINOPHEN 325 MG PO TABS: 650.0000 mg | ORAL_TABLET | ORAL | Status: DC | PRN

## 2017-08-15 MED ORDER — SIMETHICONE 80 MG PO CHEW: 80.0000 mg | CHEWABLE_TABLET | ORAL | Status: DC | PRN

## 2017-08-15 MED ORDER — FENTANYL CITRATE (PF) 100 MCG/2ML IJ SOLN
100.0000 ug | INTRAMUSCULAR | Status: DC | PRN
  Administered 2017-08-15: 100 ug via INTRAVENOUS
  Filled 2017-08-15: qty 2

## 2017-08-15 MED ORDER — OXYCODONE-ACETAMINOPHEN 5-325 MG PO TABS: 2.0000 | ORAL_TABLET | ORAL | Status: DC | PRN

## 2017-08-15 MED ORDER — WITCH HAZEL-GLYCERIN EX PADS: 1.0000 "application " | MEDICATED_PAD | CUTANEOUS | Status: DC | PRN

## 2017-08-15 MED ORDER — TETANUS-DIPHTH-ACELL PERTUSSIS 5-2.5-18.5 LF-MCG/0.5 IM SUSP: 0.5000 mL | Freq: Once | INTRAMUSCULAR | Status: DC

## 2017-08-15 MED ORDER — PRENATAL MULTIVITAMIN CH
1.0000 | ORAL_TABLET | Freq: Every day | ORAL | Status: DC
  Administered 2017-08-16: 1 via ORAL
  Filled 2017-08-15: qty 1

## 2017-08-15 MED ORDER — BENZOCAINE-MENTHOL 20-0.5 % EX AERO: 1.0000 "application " | INHALATION_SPRAY | CUTANEOUS | Status: DC | PRN

## 2017-08-15 MED ORDER — OXYTOCIN 40 UNITS IN LACTATED RINGERS INFUSION - SIMPLE MED
2.5000 [IU]/h | INTRAVENOUS | Status: DC
  Filled 2017-08-15: qty 1000

## 2017-08-15 MED ORDER — DIPHENHYDRAMINE HCL 50 MG/ML IJ SOLN: 12.5000 mg | INTRAMUSCULAR | Status: DC | PRN

## 2017-08-15 MED ORDER — IBUPROFEN 600 MG PO TABS
600.0000 mg | ORAL_TABLET | Freq: Four times a day (QID) | ORAL | Status: DC
  Administered 2017-08-15 – 2017-08-16 (×4): 600 mg via ORAL
  Filled 2017-08-15 (×4): qty 1

## 2017-08-15 MED ORDER — LIDOCAINE HCL (PF) 1 % IJ SOLN
30.0000 mL | INTRAMUSCULAR | Status: DC | PRN
  Filled 2017-08-15: qty 30

## 2017-08-15 MED ORDER — MISOPROSTOL 200 MCG PO TABS
800.0000 ug | ORAL_TABLET | Freq: Once | ORAL | Status: AC
  Administered 2017-08-15: 800 ug via RECTAL
  Filled 2017-08-15: qty 4

## 2017-08-15 MED ORDER — PHENYLEPHRINE 40 MCG/ML (10ML) SYRINGE FOR IV PUSH (FOR BLOOD PRESSURE SUPPORT)
80.0000 ug | PREFILLED_SYRINGE | INTRAVENOUS | Status: DC | PRN
  Filled 2017-08-15: qty 5
  Filled 2017-08-15 (×2): qty 10

## 2017-08-15 MED ORDER — COCONUT OIL OIL
1.0000 "application " | TOPICAL_OIL | Status: DC | PRN
  Administered 2017-08-16: 1 via TOPICAL
  Filled 2017-08-15: qty 120

## 2017-08-15 MED ORDER — OXYCODONE-ACETAMINOPHEN 5-325 MG PO TABS: 1.0000 | ORAL_TABLET | ORAL | Status: DC | PRN

## 2017-08-15 MED ORDER — ONDANSETRON HCL 4 MG/2ML IJ SOLN: 4.0000 mg | Freq: Four times a day (QID) | INTRAMUSCULAR | Status: DC | PRN

## 2017-08-15 MED ORDER — SENNOSIDES-DOCUSATE SODIUM 8.6-50 MG PO TABS
2.0000 | ORAL_TABLET | ORAL | Status: DC
  Filled 2017-08-15: qty 2

## 2017-08-15 MED ORDER — PHENYLEPHRINE 40 MCG/ML (10ML) SYRINGE FOR IV PUSH (FOR BLOOD PRESSURE SUPPORT)
80.0000 ug | PREFILLED_SYRINGE | INTRAVENOUS | Status: DC | PRN
  Administered 2017-08-15 (×2): 80 ug via INTRAVENOUS
  Filled 2017-08-15: qty 5

## 2017-08-15 MED ORDER — DIPHENHYDRAMINE HCL 25 MG PO CAPS: 25.0000 mg | ORAL_CAPSULE | Freq: Four times a day (QID) | ORAL | Status: DC | PRN

## 2017-08-15 MED ORDER — LIDOCAINE HCL (PF) 1 % IJ SOLN
INTRAMUSCULAR | Status: DC | PRN
  Administered 2017-08-15 (×2): 5 mL

## 2017-08-15 MED ORDER — DIBUCAINE 1 % RE OINT: 1.0000 "application " | TOPICAL_OINTMENT | RECTAL | Status: DC | PRN

## 2017-08-15 MED ORDER — PENICILLIN G POT IN DEXTROSE 60000 UNIT/ML IV SOLN
3.0000 10*6.[IU] | INTRAVENOUS | Status: DC
  Administered 2017-08-15: 3 10*6.[IU] via INTRAVENOUS
  Filled 2017-08-15 (×11): qty 50

## 2017-08-15 MED ORDER — FENTANYL 2.5 MCG/ML BUPIVACAINE 1/10 % EPIDURAL INFUSION (WH - ANES)
14.0000 mL/h | INTRAMUSCULAR | Status: DC | PRN
  Administered 2017-08-15: 14 mL/h via EPIDURAL
  Filled 2017-08-15 (×2): qty 100

## 2017-08-15 MED ORDER — SOD CITRATE-CITRIC ACID 500-334 MG/5ML PO SOLN: 30.0000 mL | ORAL | Status: DC | PRN

## 2017-08-15 MED ORDER — ONDANSETRON HCL 4 MG PO TABS: 4.0000 mg | ORAL_TABLET | ORAL | Status: DC | PRN

## 2017-08-15 NOTE — Anesthesia Pain Management Evaluation Note (Signed)
  CRNA Pain Management Visit Note  Patient: Julie [name], 27 y.o., female  "Hello I am a [facility] of the anesthesia [facility] at [facility]. We have an anesthesia team available at all times to provide care throughout the hospital, including epidural management and anesthesia for C-section. I don't know your plan for the delivery whether it a natural birth, water birth, IV sedation, nitrous supplementation, doula or epidural, but we want to meet your pain goals."   1.Was your pain managed to your expectations on prior hospitalizations?   Yes   2.What is your expectation for pain management during this hospitalization?     Epidural  3.How can we help you reach that goal?   Record the patient's initial score and the patient's pain goal.   Pain: 9  Pain Goal: 5 [facility] wants you to be able to say your pain was always managed very well.  Laban EmperorMalinova,Tajanae Guilbault Hristova 08/15/2017

## 2017-08-15 NOTE — Anesthesia Preprocedure Evaluation (Signed)
Anesthesia Evaluation  Patient identified by MRN, date of birth, ID band Patient awake    Reviewed: Allergy & Precautions, H&P , NPO status , Patient's Chart, lab work & pertinent test results  History of Anesthesia Complications Negative for: history of anesthetic complications  Airway Mallampati: II  TM Distance: >3 FB Neck ROM: full    Dental no notable dental hx. (+) Teeth Intact   Pulmonary neg pulmonary ROS,    Pulmonary exam normal breath sounds clear to auscultation       Cardiovascular hypertension, negative cardio ROS Normal cardiovascular exam Rhythm:regular Rate:Normal     Neuro/Psych negative neurological ROS  negative psych ROS   GI/Hepatic negative GI ROS, Neg liver ROS,   Endo/Other  negative endocrine ROS  Renal/GU negative Renal ROS  negative genitourinary   Musculoskeletal   Abdominal   Peds  Hematology negative hematology ROS (+)   Anesthesia Other Findings   Reproductive/Obstetrics (+) Pregnancy                             Anesthesia Physical Anesthesia Plan  ASA: II  Anesthesia Plan: Epidural   Post-op Pain Management:    Induction:   PONV Risk Score and Plan:   Airway Management Planned:   Additional Equipment:   Intra-op Plan:   Post-operative Plan:   Informed Consent: I have reviewed the patients History and Physical, chart, labs and discussed the procedure including the risks, benefits and alternatives for the proposed anesthesia with the patient or authorized representative who has indicated his/her understanding and acceptance.     Plan Discussed with:   Anesthesia Plan Comments:         Anesthesia Quick Evaluation  

## 2017-08-15 NOTE — Anesthesia Postprocedure Evaluation (Signed)
Anesthesia Post Note  Patient: Julie [name]  Procedure(s) Performed: AN AD HOC LABOR EPIDURAL     Patient location during evaluation: Mother Baby Anesthesia Type: Epidural Level of consciousness: awake, awake and alert and oriented Pain management: pain level controlled Vital Signs Assessment: post-procedure vital signs reviewed and stable Respiratory status: spontaneous breathing, nonlabored ventilation and respiratory function stable Cardiovascular status: stable Postop Assessment: no headache, no backache, no apparent nausea or vomiting, adequate PO intake and patient able to bend at knees Anesthetic complications: no    Last Vitals:  Vitals:   08/15/17 1337 08/15/17 1800  BP: 119/76 126/71  Pulse: 84 91  Resp: 18 18  Temp: 36.7 C 37.1 C  SpO2: 100%     Last Pain:  Vitals:   08/15/17 1533  TempSrc:   PainSc: 0-No pain   Pain Goal: Patients Stated Pain Goal: 3 (08/15/17 1337)               Janiah Devinney

## 2017-08-15 NOTE — H&P (Signed)
Julie [name] is a 27 y.o. female presenting for onset of painful contractions .  Has received care at HD since 13 weeks  OB History    Gravida Para Term Preterm AB Living   2 1 1     1    SAB TAB Ectopic Multiple Live Births         0 1     Past Medical History:  Diagnosis Date  . Anemia   . Pregnancy induced hypertension    Past Surgical History:  Procedure Laterality Date  . NO PAST SURGERIES     Family History: family history includes Diabetes in her paternal aunt. Social History:  reports that  has never smoked. she has never used smokeless tobacco. She reports that she does not drink alcohol or use drugs.     Maternal Diabetes: No Genetic Screening: Normal Maternal Ultrasounds/Referrals: Normal Fetal Ultrasounds or other Referrals:  None Maternal Substance Abuse:  No Significant Maternal Medications:  None Significant Maternal Lab Results:  Lab values include: Group B Strep positive Other Comments:  None  Review of Systems  Constitutional: Negative for chills, fever and malaise/fatigue.  Eyes: Negative for blurred vision.  Respiratory: Negative for shortness of breath.   Cardiovascular: Negative for leg swelling.  Gastrointestinal: Positive for abdominal pain. Negative for constipation, diarrhea, nausea and vomiting.  Musculoskeletal: Negative for back pain.  Neurological: Negative for dizziness, focal weakness and headaches.   Maternal Medical History:  Reason for admission: Contractions.  Nausea.  Contractions: Onset was 1-2 hours ago.   Frequency: regular.   Perceived severity is moderate.    Fetal activity: Perceived fetal activity is normal.   Last perceived fetal movement was within the past hour.    Prenatal complications: No bleeding, PIH, infection, placental abnormality, pre-eclampsia or preterm labor.   Prenatal Complications - Diabetes: none.    Dilation: 5 Effacement (%): 60 Station: -2 Exam by:: Data processing manager Blood pressure  122/61, pulse 82, temperature 97.7 F (36.5 C), temperature source Oral, resp. rate 15, height 5\' 2"  (1.575 m), weight 195 lb (88.5 kg), last menstrual period 11/06/2016, SpO2 99 %, currently breastfeeding. Maternal Exam:  Uterine Assessment: Contraction strength is moderate.  Contraction frequency is regular.   Abdomen: Patient reports no abdominal tenderness. Fundal height is 40.   Estimated fetal weight is 7.5.   Fetal presentation: vertex  Introitus: Normal vulva. Normal vagina.  Ferning test: not done.  Nitrazine test: not done.  Pelvis: adequate for delivery.   Cervix: Cervix evaluated by digital exam.     Fetal Exam Fetal Monitor Review: Mode: ultrasound.   Baseline rate: 130.  Variability: moderate (6-25 bpm).   Pattern: accelerations present and no decelerations.    Fetal State Assessment: Category I - tracings are normal.     Physical Exam  Constitutional: She is oriented to person, place, and time. She appears well-developed and well-nourished. No distress.  HENT:  Head: Normocephalic.  Cardiovascular: Normal rate and regular rhythm.  Respiratory: Effort normal. No respiratory distress.  GI: Soft. She exhibits no distension. There is no tenderness. There is no rebound and no guarding.  Genitourinary:  Genitourinary Comments: Dilation: 5 Effacement (%): 60 Station: -2 Presentation: Vertex Exam by:: Latricia Heft RN   Musculoskeletal: Normal range of motion.  Neurological: She is alert and oriented to person, place, and time.  Skin: Skin is warm and dry.  Psychiatric: She has a normal mood and affect.    Prenatal labs: ABO, Rh:   Antibody:  Rubella:   RPR:    HBsAg:    HIV:    GBS:     Assessment/Plan: Single IUP at 5429w2d Active Labor GBS +  Admit to Birthing Suites Routine orders PCN prophylaxis  Wants epidural   Wynelle BourgeoisMarie Madinah Quarry 08/15/2017, 6:41 AM

## 2017-08-15 NOTE — MAU Note (Signed)
CTX 4-5 mins apart.  No VB, some increased discharge that has become watery.  Reports good FM.  Has not yet had a cervical exam.  GBS +

## 2017-08-15 NOTE — Anesthesia Procedure Notes (Signed)
Epidural Patient location during procedure: OB  Staffing Anesthesiologist: Asani Deniston, MD Performed: anesthesiologist   Preanesthetic Checklist Completed: patient identified, site marked, surgical consent, pre-op evaluation, timeout performed, IV checked, risks and benefits discussed and monitors and equipment checked  Epidural Patient position: sitting Prep: DuraPrep Patient monitoring: heart rate, continuous pulse ox and blood pressure Approach: right paramedian Location: L3-L4 Injection technique: LOR saline  Needle:  Needle type: Tuohy  Needle gauge: 17 G Needle length: 9 cm and 9 Needle insertion depth: 7 cm Catheter type: closed end flexible Catheter size: 20 Guage Catheter at skin depth: 11 cm Test dose: negative  Assessment Events: blood not aspirated, injection not painful, no injection resistance, negative IV test and no paresthesia  Additional Notes Patient identified. Risks/Benefits/Options discussed with patient including but not limited to bleeding, infection, nerve damage, paralysis, failed block, incomplete pain control, headache, blood pressure changes, nausea, vomiting, reactions to medication both or allergic, itching and postpartum back pain. Confirmed with bedside nurse the patient's most recent platelet count. Confirmed with patient that they are not currently taking any anticoagulation, have any bleeding history or any family history of bleeding disorders. Patient expressed understanding and wished to proceed. All questions were answered. Sterile technique was used throughout the entire procedure. Please see nursing notes for vital signs. Test dose was given through epidural needle and negative prior to continuing to dose epidural or start infusion. Warning signs of high block given to the patient including shortness of breath, tingling/numbness in hands, complete motor block, or any concerning symptoms with instructions to call for help. Patient was given  instructions on fall risk and not to get out of bed. All questions and concerns addressed with instructions to call with any issues.     

## 2017-08-16 MED ORDER — IBUPROFEN 600 MG PO TABS: 600.0000 mg | ORAL_TABLET | Freq: Four times a day (QID) | ORAL | 0 refills | Status: DC

## 2017-08-16 NOTE — Progress Notes (Signed)
Post Partum Day 1 from SVD Subjective: no complaints. Patient doing well breast feeding. minimal pain well controlled with motrin. Minimal lochia.   Objective: Blood pressure 102/66, pulse 81, temperature 97.7 F (36.5 C), temperature source Oral, resp. rate 17, height 5\' 2"  (1.575 m), weight 183 lb 9.6 oz (83.3 kg), last menstrual period 11/06/2016, SpO2 100 %, unknown if currently breastfeeding.  Physical Exam:  General: alert, cooperative and appears stated age Lochia: appropriate Uterine Fundus: firm Incision:n/a DVT Evaluation: No evidence of DVT seen on physical exam. Negative Homan's sign.  Recent Labs    08/15/17 0555  HGB 10.6*  HCT 32.8*    Assessment/Plan: PPD 1 SVD doing well Possible discharge later today Minimal pain Breast feeding Minimal lochia    LOS: 1 day   Julie [name] 08/16/2017, 7:48 AM

## 2017-08-16 NOTE — Addendum Note (Signed)
Addendum  created 08/16/17 0806 by Rica Recordsickelton, Jonna Dittrich, CRNA   Sign clinical note

## 2017-08-16 NOTE — Anesthesia Postprocedure Evaluation (Signed)
Anesthesia Post Note  Patient: Julie [name]  Procedure(s) Performed: AN AD HOC LABOR EPIDURAL     Patient location during evaluation: Mother Baby Anesthesia Type: Epidural Level of consciousness: awake and alert Pain management: pain level controlled Vital Signs Assessment: post-procedure vital signs reviewed and stable Respiratory status: spontaneous breathing, nonlabored ventilation and respiratory function stable Cardiovascular status: stable Postop Assessment: no headache, no backache, epidural receding and no apparent nausea or vomiting Anesthetic complications: no    Last Vitals:  Vitals:   08/16/17 0200 08/16/17 0552  BP: 102/60 102/66  Pulse: 85 81  Resp: 17 17  Temp: 36.7 C 36.5 C  SpO2: 98% 100%    Last Pain:  Vitals:   08/16/17 0552  TempSrc: Oral  PainSc:    Pain Goal: Patients Stated Pain Goal: 3 (08/15/17 1800)               Rica RecordsICKELTON,Maeve Debord

## 2017-08-16 NOTE — Lactation Note (Signed)
This note was copied from a baby's chart. Lactation Consultation Note Baby 16 hrs old BF in side lying position. Mom states BF going well. Mom BF her 1st child for 1 yr and 8 months. Mom denies painful latch. Mom doesn't have any questions. Newborn behavior, STS, I&O, cluster feeding discussed.  Mom encouraged to feed baby 8-12 times/24 hours and with feeding cues.  Encouraged to call for questions or assistance.  [facility] brochure given w/resources, support groups and LC services.  Patient Name: Julie [name]: 08/16/2017 Reason for consult: Initial assessment   Maternal Data Has patient been taught Hand Expression?: Yes Does the patient have breastfeeding experience prior to this delivery?: Yes  Feeding Feeding Type: Breast Fed Length of feed: 15 min(still BF)  LATCH Score Latch: Grasps breast easily, tongue down, lips flanged, rhythmical sucking.  Audible Swallowing: A few with stimulation  Type of Nipple: Everted at rest and after stimulation  Comfort (Breast/Nipple): Soft / non-tender  Hold (Positioning): No assistance needed to correctly position infant at breast.  LATCH Score: 9  Interventions Interventions: Breast feeding basics reviewed;Position options  Lactation Tools Discussed/Used WIC Program: Yes   Consult Status Consult Status: Follow-up [name]: 08/17/17 Follow-up type: In-patient    [name], Diamond NickelLAURA G 08/16/2017, 3:35 AM

## 2017-08-16 NOTE — Discharge Summary (Signed)
OB Discharge Summary     Patient Name: Julie [name] DOB: May 29, 1991 MRN: [id number]  Date of admission: 08/15/2017 Delivering MD: Nigel BridgemanWINBORNE, Arlina Sabina   Date of discharge: 08/16/2017  Admitting diagnosis: 40.2 WEEKS CTX Intrauterine pregnancy: 2929w2d     Secondary diagnosis:  Active Problems:   Normal labor   Vaginal delivery  Additional problems: anemia      Discharge diagnosis: Term Pregnancy Delivered                                                                                                Post partum procedures:none  Augmentation: none   Complications: None  Hospital course:  Onset of Labor With Vaginal Delivery     27 y.o. yo [id number] at 1429w2d was admitted in Active Labor on 08/15/2017. Patient had an uncomplicated labor course as follows:  Membrane Rupture Time/Date: 9:04 AM ,08/15/2017   Intrapartum Procedures: Episiotomy: None [1]                                         Lacerations:  None [1]  Patient had a delivery of a Viable infant. 08/15/2017  Information for the patient's newborn:  Burnard HawthorneMohammed Dan [name], Boy Johan [[id number]][[id number]]  Delivery Method: Vaginal, Spontaneous(Filed from Delivery Summary)    Pateint had an uncomplicated postpartum course.  She is ambulating, tolerating a regular diet, passing flatus, and urinating well. Patient is discharged home in stable condition on 08/16/17.   Physical exam  Vitals:   08/15/17 1337 08/15/17 1800 08/16/17 0200 08/16/17 0552  BP: 119/76 126/71 102/60 102/66  Pulse: 84 91 85 81  Resp: 18 18 17 17   Temp: 98 F (36.7 C) 98.8 F (37.1 C) 98 F (36.7 C) 97.7 F (36.5 C)  TempSrc: Oral Oral Oral Oral  SpO2: 100%  98% 100%  Weight:    183 lb 9.6 oz (83.3 kg)  Height:       General: alert Lochia: appropriate Uterine Fundus: firm Incision: N/A DVT Evaluation: No evidence of DVT seen on physical exam. Negative Homan's sign. No cords or calf tenderness. Labs: Lab Results  Component Value Date   WBC 7.1 08/15/2017   HGB 10.6 (L) 08/15/2017   HCT 32.8 (L) 08/15/2017   MCV 74.7 (L) 08/15/2017   PLT 232 08/15/2017   CMP Latest Ref Rng & Units 09/05/2015  Glucose 65 - 99 mg/dL 95  BUN 6 - 20 mg/dL 12  Creatinine 0.160.44 - 0.101.00 mg/dL 9.320.61  Sodium 355135 - 732145 mmol/L 141  Potassium 3.5 - 5.1 mmol/L 4.1  Chloride 101 - 111 mmol/L 109  CO2 22 - 32 mmol/L 23  Calcium 8.9 - 10.3 mg/dL 9.5  Total Protein 6.5 - 8.1 g/dL 7.9  Total Bilirubin 0.3 - 1.2 mg/dL 0.6  Alkaline Phos 38 - 126 U/L 62  AST 15 - 41 U/L 16  ALT 14 - 54 U/L 12(L)    Discharge instruction: per After Visit Summary and "Baby and Me Booklet".  After visit meds:  Allergies as of 08/16/2017   No Known Allergies     Medication List    TAKE these medications   ibuprofen 600 MG tablet Commonly known as:  ADVIL,MOTRIN Take 1 tablet (600 mg total) by mouth every 6 (six) hours.   PRENATAL VITAMIN PO Take by mouth.       Diet: routine diet  Activity: Advance as tolerated. Pelvic rest for 6 weeks.   Outpatient follow up:4 weeks  Follow up Appt:No future appointments. Follow up Visit:No Follow-up on file.  Postpartum contraception: Depo Provera  Newborn Data: Live born female  Birth Weight: 7 lb 6.2 oz (3350 g) APGAR: 8, 9  Newborn Delivery   Birth date/time:  08/15/2017 10:37:00 Delivery type:  Vaginal, Spontaneous     Baby Feeding: Breast Disposition:home with mother   08/16/2017 Nigel Bridgeman, MD

## 2017-08-20 ENCOUNTER — Inpatient Hospital Stay (HOSPITAL_COMMUNITY): Payer: BLUE CROSS/BLUE SHIELD

## 2018-03-17 ENCOUNTER — Ambulatory Visit (HOSPITAL_COMMUNITY)
Admission: EM | Admit: 2018-03-17 | Discharge: 2018-03-17 | Disposition: A | Discharge status: Home / Self Care | Payer: Medicaid Other

## 2018-03-17 ENCOUNTER — Encounter (HOSPITAL_COMMUNITY): Payer: Self-pay | Admitting: Emergency Medicine

## 2018-03-17 DIAGNOSIS — H9012 Conductive hearing loss, unilateral, left ear, with unrestricted hearing on the contralateral side: Secondary | ICD-10-CM

## 2018-03-17 NOTE — ED Triage Notes (Signed)
Pt c/o L ear pain x 3 days 

## 2018-03-17 NOTE — Discharge Instructions (Signed)
COme back as needed.

## 2018-03-17 NOTE — ED Provider Notes (Signed)
03/17/2018 5:56 PM   DOB: 1991-03-13 / MRN: [id number]  SUBJECTIVE:  Julie [name] is a 27 y.o. female presenting for left ear pain and loss of hearing that started a few days ago.   Assoicates mild pain about the right ear.  Denies fever.  Has tried nothing.   She has No Known Allergies.   She  has a past medical history of Anemia and Pregnancy induced hypertension.    She  reports that she has never smoked. She has never used smokeless tobacco. She reports that she does not drink alcohol or use drugs. She  reports that she currently engages in sexual activity. She reports using the following method of birth control/protection: None. The patient  has a past surgical history that includes No past surgeries.  Her family history includes Diabetes in her paternal aunt.  Review of Systems  Constitutional: Negative for fever.  HENT: Negative for sore throat and tinnitus.   Skin: Negative for rash.  Neurological: Negative for dizziness.    OBJECTIVE:  BP 117/80   Pulse 64   Temp (!) 97.5 F (36.4 C)   Resp 16   SpO2 100%   Wt Readings from Last 3 Encounters:  08/16/17 183 lb 9.6 oz (83.3 kg)  03/21/17 177 lb (80.3 kg)  02/06/17 174 lb 12.8 oz (79.3 kg)   Temp Readings from Last 3 Encounters:  03/17/18 (!) 97.5 F (36.4 C)  08/16/17 97.7 F (36.5 C) (Oral)  01/21/17 98.1 F (36.7 C) (Oral)   BP Readings from Last 3 Encounters:  03/17/18 117/80  08/16/17 102/66  03/21/17 112/75   Pulse Readings from Last 3 Encounters:  03/17/18 64  08/16/17 81  03/21/17 87    Physical Exam  Constitutional: She is oriented to person, place, and time. She appears well-nourished. No distress.  HENT:  Right Ear: Tympanic membrane normal.  Ears:  Eyes: Pupils are equal, round, and reactive to light. EOM are normal.  Cardiovascular: Normal rate.  Pulmonary/Chest: Effort normal.  Abdominal: She exhibits no distension.  Neurological: She is alert and oriented to person,  place, and time. No cranial nerve deficit. Gait normal.  Skin: Skin is dry. She is not diaphoretic.  Psychiatric: She has a normal mood and affect.  Vitals reviewed.    No results found for this or any previous visit (from the past 72 hour(s)).  No results found.  ASSESSMENT AND PLAN:   Conductive hearing loss of left ear, unspecified hearing status on contralateral side - Cerumen impaction dislodged in the clinic.  Post lavage hearing intact to finger rub and TM pearly grey with good cone of light. RTC PRN.    Discharge Instructions     COme back as needed.         The patient is advised to call or return to clinic if she does not see an improvement in symptoms, or to seek the care of the closest emergency department if she worsens with the above plan.   Deliah Boston, MHS, PA-C 03/17/2018 5:56 PM   Ofilia Neas, PA-C 03/17/18 1757

## 2019-06-06 ENCOUNTER — Inpatient Hospital Stay (HOSPITAL_COMMUNITY)
Admission: AD | Admit: 2019-06-06 | Discharge: 2019-06-06 | Disposition: A | Discharge status: Home / Self Care | Payer: Medicaid Other | Attending: Family Medicine | Admitting: Family Medicine

## 2019-06-06 ENCOUNTER — Other Ambulatory Visit: Payer: Self-pay

## 2019-06-06 ENCOUNTER — Encounter (HOSPITAL_COMMUNITY): Payer: Self-pay

## 2019-06-06 ENCOUNTER — Inpatient Hospital Stay (HOSPITAL_COMMUNITY): Payer: Medicaid Other

## 2019-06-06 DIAGNOSIS — Z3491 Encounter for supervision of normal pregnancy, unspecified, first trimester: Secondary | ICD-10-CM

## 2019-06-06 DIAGNOSIS — O26891 Other specified pregnancy related conditions, first trimester: Secondary | ICD-10-CM | POA: Insufficient documentation

## 2019-06-06 DIAGNOSIS — O219 Vomiting of pregnancy, unspecified: Secondary | ICD-10-CM | POA: Insufficient documentation

## 2019-06-06 DIAGNOSIS — R109 Unspecified abdominal pain: Secondary | ICD-10-CM | POA: Diagnosis not present

## 2019-06-06 DIAGNOSIS — Z3A08 8 weeks gestation of pregnancy: Secondary | ICD-10-CM | POA: Insufficient documentation

## 2019-06-06 DIAGNOSIS — O218 Other vomiting complicating pregnancy: Secondary | ICD-10-CM | POA: Diagnosis present

## 2019-06-06 LAB — URINALYSIS, ROUTINE W REFLEX MICROSCOPIC
Bacteria, UA: NONE SEEN
Bilirubin Urine: NEGATIVE
Glucose, UA: NEGATIVE mg/dL
Hgb urine dipstick: NEGATIVE
Ketones, ur: NEGATIVE mg/dL
Leukocytes,Ua: NEGATIVE
Nitrite: NEGATIVE
Protein, ur: 30 mg/dL — AB
Specific Gravity, Urine: 1.028 (ref 1.005–1.030)
pH: 6 (ref 5.0–8.0)

## 2019-06-06 LAB — CBC
HCT: 36.8 % (ref 36.0–46.0)
Hemoglobin: 11.6 g/dL — ABNORMAL LOW (ref 12.0–15.0)
MCH: 23.6 pg — ABNORMAL LOW (ref 26.0–34.0)
MCHC: 31.5 g/dL (ref 30.0–36.0)
MCV: 74.8 fL — ABNORMAL LOW (ref 80.0–100.0)
Platelets: 377 10*3/uL (ref 150–400)
RBC: 4.92 MIL/uL (ref 3.87–5.11)
RDW: 14.6 % (ref 11.5–15.5)
WBC: 7.8 10*3/uL (ref 4.0–10.5)
nRBC: 0 % (ref 0.0–0.2)

## 2019-06-06 LAB — WET PREP, GENITAL
Clue Cells Wet Prep HPF POC: NONE SEEN
Sperm: NONE SEEN
Trich, Wet Prep: NONE SEEN
Yeast Wet Prep HPF POC: NONE SEEN

## 2019-06-06 LAB — HCG, QUANTITATIVE, PREGNANCY: hCG, Beta Chain, Quant, S: 56944 m[IU]/mL — ABNORMAL HIGH (ref <5)

## 2019-06-06 LAB — POCT PREGNANCY, URINE: Preg Test, Ur: POSITIVE — AB

## 2019-06-06 LAB — HIV ANTIBODY (ROUTINE TESTING W REFLEX): HIV Screen 4th Generation wRfx: NONREACTIVE

## 2019-06-06 MED ORDER — METOCLOPRAMIDE HCL 10 MG PO TABS
10.0000 mg | ORAL_TABLET | Freq: Once | ORAL | Status: AC
  Administered 2019-06-06: 10 mg via ORAL
  Filled 2019-06-06: qty 1

## 2019-06-06 MED ORDER — PROMETHAZINE HCL 25 MG PO TABS: 12.5000 mg | ORAL_TABLET | Freq: Four times a day (QID) | ORAL | 2 refills | Status: DC | PRN

## 2019-06-06 MED ORDER — PROMETHAZINE HCL 25 MG PO TABS: 12.5000 mg | ORAL_TABLET | Freq: Every day | ORAL | 2 refills | Status: DC

## 2019-06-06 MED ORDER — PROMETHAZINE HCL 25 MG PO TABS
25.0000 mg | ORAL_TABLET | Freq: Once | ORAL | Status: DC
  Filled 2019-06-06: qty 1

## 2019-06-06 MED ORDER — METOCLOPRAMIDE HCL 10 MG PO TABS: 10.0000 mg | ORAL_TABLET | Freq: Three times a day (TID) | ORAL | 2 refills | Status: DC

## 2019-06-06 NOTE — MAU Note (Signed)
Julie [name] is a 28 y.o. at [redacted]w[redacted]d here in MAU reporting: since yesterday anytime she eats or drinks it comes back up, unable to keep down anything. Emesis x 7 in the last 24 hours. Does not have nausea in between episodes of vomiting. No diarrhea in the last 24 hours. No vaginal bleeding or discharge. Reports abdominal pain at night but none currently.  LMP: 04/08/19  Onset of complaint: since yesterday  Pain score: 0/10  Vitals:   06/06/19 1019  BP: 111/73  Pulse: 86  Resp: 16  Temp: 98.1 F (36.7 C)  SpO2: 99%     Lab orders placed from triage: UA, UPT  Urine sent to lab, not enough sample to collect culture tube.

## 2019-06-06 NOTE — MAU Provider Note (Signed)
Chief Complaint: Emesis   First Provider Initiated Contact with Patient 06/06/19 1145      SUBJECTIVE HPI: Julie [name] is a 28 y.o. G3P2002 at [redacted]w[redacted]d by LMP who presents to maternity admissions reporting nausea and vomiting multiple times per day x 2 days and abdominal pain at night. She reports the abdominal pain is low in her abdomen, cramping intermittent pain that occurs mostly at night. She does not have any other symptoms. She has not tried any treatments.  She denies vaginal bleeding, vaginal itching/burning, urinary symptoms, h/a, dizziness, or fever/chills.     HPI  Past Medical History:  Diagnosis Date  . Anemia   . Pregnancy induced hypertension    Past Surgical History:  Procedure Laterality Date  . NO PAST SURGERIES    . WISDOM TOOTH EXTRACTION     Social History   Socioeconomic History  . Marital status: Married    Spouse name: Not on file  . Number of children: Not on file  . Years of education: Not on file  . Highest education level: Not on file  Occupational History  . Not on file  Social Needs  . Financial resource strain: Not on file  . Food insecurity    Worry: Not on file    Inability: Not on file  . Transportation needs    Medical: Not on file    Non-medical: Not on file  Tobacco Use  . Smoking status: Never Smoker  . Smokeless tobacco: Never Used  Substance and Sexual Activity  . Alcohol use: No  . Drug use: No  . Sexual activity: Yes    Birth control/protection: None  Lifestyle  . Physical activity    Days per week: Not on file    Minutes per session: Not on file  . Stress: Not on file  Relationships  . Social Musician on phone: Not on file    Gets together: Not on file    Attends religious service: Not on file    Active member of club or organization: Not on file    Attends meetings of clubs or organizations: Not on file    Relationship status: Not on file  . Intimate partner violence    Fear of current  or ex partner: Not on file    Emotionally abused: Not on file    Physically abused: Not on file    Forced sexual activity: Not on file  Other Topics Concern  . Not on file  Social History Narrative  . Not on file   No current facility-administered medications on file prior to encounter.    Current Outpatient Medications on File Prior to Encounter  Medication Sig Dispense Refill  . Prenatal Vit-Fe Fumarate-FA (PRENATAL VITAMIN PO) Take by mouth.     No Known Allergies  ROS:  Review of Systems  Constitutional: Negative for chills, fatigue and fever.  HENT: Negative for sinus pressure.   Eyes: Negative for photophobia.  Respiratory: Negative for shortness of breath.   Cardiovascular: Negative for chest pain.  Gastrointestinal: Positive for abdominal pain, nausea and vomiting. Negative for constipation and diarrhea.  Genitourinary: Negative for difficulty urinating, dysuria, flank pain, frequency, pelvic pain, vaginal bleeding, vaginal discharge and vaginal pain.  Musculoskeletal: Negative for neck pain.  Neurological: Negative for dizziness, weakness and headaches.  Psychiatric/Behavioral: Negative.      I have reviewed patient's Past Medical Hx, Surgical Hx, Family Hx, Social Hx, medications and allergies.   Physical Exam  Patient Vitals for the past 24 hrs:  BP Temp Temp src Pulse Resp SpO2 Height Weight  06/06/19 1447 120/70 - - 76 17 100 % - -  06/06/19 1019 111/73 98.1 F (36.7 C) Oral 86 16 99 % 5\' 2"  (1.575 m) 89.7 kg   Constitutional: Well-developed, well-nourished female in no acute distress.  Cardiovascular: normal rate Respiratory: normal effort GI: Abd soft, non-tender. Pos BS x 4 MS: Extremities nontender, no edema, normal ROM Neurologic: Alert and oriented x 4.  GU: Neg CVAT.  PELVIC EXAM: Wet prep/GCC collected by blind swab  LAB RESULTS Results for orders placed or performed during the hospital encounter of 06/06/19 (from the past 24 hour(s))   Pregnancy, urine POC     Status: Abnormal   Collection Time: 06/06/19 10:13 AM  Result Value Ref Range   Preg Test, Ur POSITIVE (A) NEGATIVE  Urinalysis, Routine w reflex microscopic     Status: Abnormal   Collection Time: 06/06/19 10:14 AM  Result Value Ref Range   Color, Urine YELLOW YELLOW   APPearance HAZY (A) CLEAR   Specific Gravity, Urine 1.028 1.005 - 1.030   pH 6.0 5.0 - 8.0   Glucose, UA NEGATIVE NEGATIVE mg/dL   Hgb urine dipstick NEGATIVE NEGATIVE   Bilirubin Urine NEGATIVE NEGATIVE   Ketones, ur NEGATIVE NEGATIVE mg/dL   Protein, ur 30 (A) NEGATIVE mg/dL   Nitrite NEGATIVE NEGATIVE   Leukocytes,Ua NEGATIVE NEGATIVE   RBC / HPF 0-5 0 - 5 RBC/hpf   WBC, UA 0-5 0 - 5 WBC/hpf   Bacteria, UA NONE SEEN NONE SEEN   Squamous Epithelial / LPF 0-5 0 - 5   Mucus PRESENT   CBC     Status: Abnormal   Collection Time: 06/06/19 11:30 AM  Result Value Ref Range   WBC 7.8 4.0 - 10.5 K/uL   RBC 4.92 3.87 - 5.11 MIL/uL   Hemoglobin 11.6 (L) 12.0 - 15.0 g/dL   HCT 36.8 36.0 - 46.0 %   MCV 74.8 (L) 80.0 - 100.0 fL   MCH 23.6 (L) 26.0 - 34.0 pg   MCHC 31.5 30.0 - 36.0 g/dL   RDW 14.6 11.5 - 15.5 %   Platelets 377 150 - 400 K/uL   nRBC 0.0 0.0 - 0.2 %  hCG, quantitative, pregnancy     Status: Abnormal   Collection Time: 06/06/19 11:30 AM  Result Value Ref Range   hCG, Beta Chain, Quant, S 56,944 (H) <5 mIU/mL  HIV antibody     Status: None   Collection Time: 06/06/19 11:30 AM  Result Value Ref Range   HIV Screen 4th Generation wRfx NON REACTIVE NON REACTIVE  Wet prep, genital     Status: Abnormal   Collection Time: 06/06/19 11:58 AM  Result Value Ref Range   Yeast Wet Prep HPF POC NONE SEEN NONE SEEN   Trich, Wet Prep NONE SEEN NONE SEEN   Clue Cells Wet Prep HPF POC NONE SEEN NONE SEEN   WBC, Wet Prep HPF POC MANY (A) NONE SEEN   Sperm NONE SEEN        IMAGING US Ob Less Than 14 Weeks With Ob Transvaginal  Result Date: 06/06/2019 CLINICAL DATA:  Abdominal pain  during 1st trimester pregnancy. EXAM: OBSTETRIC <14 WK [place] AND TRANSVAGINAL OB US TECHNIQUE: Both transabdominal and transvaginal ultrasound examinations were performed for complete evaluation of the gestation as well as the maternal uterus, adnexal regions, and pelvic cul-de-sac. Transvaginal technique was performed to assess early  pregnancy. COMPARISON:  None. FINDINGS: Intrauterine gestational sac: Single Yolk sac:  Visualized. Embryo:  Visualized. Cardiac Activity: Visualized. Heart Rate: 160 bpm CRL:  13 mm   7 w   4 d                  US EDC: 01/19/2020 Subchorionic hemorrhage:  None. Maternal uterus/adnexae: Both ovaries are normal in appearance. No mass or abnormal free fluid identified. IMPRESSION: Single living IUP with estimated gestational age of [redacted] weeks 4 days, and US EDC of 01/19/2020. No maternal uterine or adnexal abnormality identified. Electronically Signed   By: Danae OrleansJohn A Stahl M.D.   On: 06/06/2019 12:59    MAU Management/MDM: Orders Placed This Encounter  Procedures  . Wet prep, genital  . US OB LESS THAN 14 WEEKS WITH OB TRANSVAGINAL  . Urinalysis, Routine w reflex microscopic  . CBC  . hCG, quantitative, pregnancy  . HIV antibody  . Pregnancy, urine POC  . Discharge patient    Meds ordered this encounter  Medications  . promethazine (PHENERGAN) tablet 25 mg  . metoCLOPramide (REGLAN) tablet 10 mg  . metoCLOPramide (REGLAN) 10 MG tablet    Sig: Take 1 tablet (10 mg total) by mouth 3 (three) times daily before meals.    Dispense:  30 tablet    Refill:  2    Order Specific Question:   Supervising Provider    Answer:   Reva BoresPRATT, TANYA S [[id number]]  . DISCONTD: promethazine (PHENERGAN) 25 MG tablet    Sig: Take 0.5-1 tablets (12.5-25 mg total) by mouth every 6 (six) hours as needed for nausea.    Dispense:  30 tablet    Refill:  2    Order Specific Question:   Supervising Provider    Answer:   Reva BoresPRATT, TANYA S [[id number]]  . promethazine (PHENERGAN) 25 MG tablet    Sig: Take 0.5-1  tablets (12.5-25 mg total) by mouth at bedtime.    Dispense:  30 tablet    Refill:  2    Order Specific Question:   Supervising Provider    Answer:   Reva BoresPRATT, TANYA S [[id number]]    Pt with IUP on today's US, dates 5+ days different from LMP so [facility]  Nausea improved with PO dose of Reglan in MAU.  Rx for Reglan TID PRN with meals, Phenergan Q HS sent to pharmacy.  F/U with early prenatal care, pt given list of providers.  Pt discharged with strict return precautions.  ASSESSMENT 1. Nausea and vomiting during pregnancy prior to [redacted] weeks gestation   2. Abdominal pain during pregnancy in first trimester   3. Normal IUP (intrauterine pregnancy) on prenatal ultrasound, first trimester     PLAN Discharge home Allergies as of 06/06/2019   No Known Allergies     Medication List    STOP taking these medications   ibuprofen 600 MG tablet Commonly known as: ADVIL     TAKE these medications   metoCLOPramide 10 MG tablet Commonly known as: REGLAN Take 1 tablet (10 mg total) by mouth 3 (three) times daily before meals.   PRENATAL VITAMIN PO Take by mouth.   promethazine 25 MG tablet Commonly known as: PHENERGAN Take 0.5-1 tablets (12.5-25 mg total) by mouth at bedtime.      Follow-up Information    [facility].   Specialty: Obstetrics and Gynecology Why: Or prenatal provider of your choice. Return to MAU as needed for emergencies. Contact  information: [address], [address] [id number] [place] [id number] [phone]          Sharen Counter Certified Nurse-Midwife 06/06/2019  3:08 PM

## 2019-06-08 LAB — GC/CHLAMYDIA PROBE AMP (~~LOC~~) NOT AT ARMC
Chlamydia: NEGATIVE
Comment: NEGATIVE
Comment: NORMAL
Neisseria Gonorrhea: NEGATIVE

## 2019-07-17 NOTE — L&D Delivery Note (Signed)
OB/GYN Faculty Practice Delivery Note  Julie [name] is a 29 y.o. T4S5681 s/p VD at [redacted]w[redacted]d. She was admitted for IOL for decel on NST in clinic.   ROM: 0h 77m with clear fluid (en caul delivery) GBS Status: Negative/-- (06/18 0000) Maximum Maternal Temperature: 99.68F  Labor Progress: . Initial SVE: 4/50/-2. Patient was started on Pitocin and received epidural. She then progressed to complete. En caul delivery with fluid rupture just after delivery.  Delivery Date/Time: 7/19 @ 1944 Delivery: Called to room and patient was complete and pushing. Head delivered ROA. Loose nuchal cord present but not reduced as en caul. Shoulder and body delivered in usual fashion. Infant with spontaneous cry, placed on mother's abdomen, dried and stimulated. Cord clamped x 2 after 1-minute delay, and cut by FOB. Cord blood drawn. Placenta delivered spontaneously with gentle cord traction. Fundus firm with massage and Pitocin. Labia, perineum, vagina, and cervix inspected inspected with no lacerations.  Baby Weight: pending  Placenta: Sent to L&D Complications: None Lacerations: None EBL: 220 mL Analgesia: Epidural   Infant:  APGAR (1 MIN): 9   APGAR (5 MINS): 9   APGAR (10 MINS):     Jerilynn Birkenhead, MD [facility] Family Medicine Fellow, [facility] for [facility], [facility] 02/01/2020, 8:13 PM

## 2020-01-01 LAB — OB RESULTS CONSOLE GBS: GBS: NEGATIVE

## 2020-01-14 DIAGNOSIS — Z419 Encounter for procedure for purposes other than remedying health state, unspecified: Secondary | ICD-10-CM | POA: Diagnosis not present

## 2020-01-19 DIAGNOSIS — O0932 Supervision of pregnancy with insufficient antenatal care, second trimester: Secondary | ICD-10-CM | POA: Diagnosis not present

## 2020-01-19 DIAGNOSIS — O99210 Obesity complicating pregnancy, unspecified trimester: Secondary | ICD-10-CM | POA: Diagnosis not present

## 2020-01-19 DIAGNOSIS — Z3483 Encounter for supervision of other normal pregnancy, third trimester: Secondary | ICD-10-CM | POA: Diagnosis not present

## 2020-01-19 DIAGNOSIS — B373 Candidiasis of vulva and vagina: Secondary | ICD-10-CM | POA: Diagnosis not present

## 2020-01-19 DIAGNOSIS — Z8759 Personal history of other complications of pregnancy, childbirth and the puerperium: Secondary | ICD-10-CM | POA: Diagnosis not present

## 2020-01-19 DIAGNOSIS — O99013 Anemia complicating pregnancy, third trimester: Secondary | ICD-10-CM | POA: Diagnosis not present

## 2020-02-01 ENCOUNTER — Inpatient Hospital Stay (HOSPITAL_COMMUNITY): Payer: Medicaid Other | Admitting: Anesthesiology

## 2020-02-01 ENCOUNTER — Inpatient Hospital Stay (HOSPITAL_COMMUNITY)
Admission: AD | Admit: 2020-02-01 | Discharge: 2020-02-03 | DRG: 807 | Disposition: A | Discharge status: Home / Self Care | Payer: Medicaid Other | Attending: Obstetrics and Gynecology | Admitting: Obstetrics and Gynecology

## 2020-02-01 ENCOUNTER — Other Ambulatory Visit: Payer: Self-pay

## 2020-02-01 ENCOUNTER — Encounter (HOSPITAL_COMMUNITY): Payer: Self-pay | Admitting: Obstetrics and Gynecology

## 2020-02-01 DIAGNOSIS — Z20822 Contact with and (suspected) exposure to covid-19: Secondary | ICD-10-CM | POA: Diagnosis present

## 2020-02-01 DIAGNOSIS — Z3A40 40 weeks gestation of pregnancy: Secondary | ICD-10-CM

## 2020-02-01 DIAGNOSIS — D649 Anemia, unspecified: Secondary | ICD-10-CM | POA: Diagnosis not present

## 2020-02-01 DIAGNOSIS — O368330 Maternal care for abnormalities of the fetal heart rate or rhythm, third trimester, not applicable or unspecified: Secondary | ICD-10-CM | POA: Diagnosis not present

## 2020-02-01 DIAGNOSIS — O99210 Obesity complicating pregnancy, unspecified trimester: Secondary | ICD-10-CM | POA: Diagnosis not present

## 2020-02-01 DIAGNOSIS — O9903 Anemia complicating the puerperium: Secondary | ICD-10-CM | POA: Diagnosis not present

## 2020-02-01 DIAGNOSIS — Z3490 Encounter for supervision of normal pregnancy, unspecified, unspecified trimester: Secondary | ICD-10-CM | POA: Diagnosis present

## 2020-02-01 DIAGNOSIS — Z8759 Personal history of other complications of pregnancy, childbirth and the puerperium: Secondary | ICD-10-CM | POA: Diagnosis not present

## 2020-02-01 DIAGNOSIS — O99013 Anemia complicating pregnancy, third trimester: Secondary | ICD-10-CM | POA: Diagnosis not present

## 2020-02-01 DIAGNOSIS — D509 Iron deficiency anemia, unspecified: Secondary | ICD-10-CM | POA: Diagnosis not present

## 2020-02-01 DIAGNOSIS — O9902 Anemia complicating childbirth: Secondary | ICD-10-CM | POA: Diagnosis present

## 2020-02-01 DIAGNOSIS — O0932 Supervision of pregnancy with insufficient antenatal care, second trimester: Secondary | ICD-10-CM | POA: Diagnosis not present

## 2020-02-01 DIAGNOSIS — O480 Post-term pregnancy: Principal | ICD-10-CM

## 2020-02-01 DIAGNOSIS — O164 Unspecified maternal hypertension, complicating childbirth: Secondary | ICD-10-CM | POA: Diagnosis not present

## 2020-02-01 DIAGNOSIS — O99893 Other specified diseases and conditions complicating puerperium: Secondary | ICD-10-CM | POA: Diagnosis not present

## 2020-02-01 DIAGNOSIS — B373 Candidiasis of vulva and vagina: Secondary | ICD-10-CM | POA: Diagnosis not present

## 2020-02-01 DIAGNOSIS — R112 Nausea with vomiting, unspecified: Secondary | ICD-10-CM | POA: Diagnosis not present

## 2020-02-01 LAB — CBC
HCT: 32.2 % — ABNORMAL LOW (ref 36.0–46.0)
Hemoglobin: 9.7 g/dL — ABNORMAL LOW (ref 12.0–15.0)
MCH: 23.4 pg — ABNORMAL LOW (ref 26.0–34.0)
MCHC: 30.1 g/dL (ref 30.0–36.0)
MCV: 77.8 fL — ABNORMAL LOW (ref 80.0–100.0)
Platelets: 261 10*3/uL (ref 150–400)
RBC: 4.14 MIL/uL (ref 3.87–5.11)
RDW: 15.7 % — ABNORMAL HIGH (ref 11.5–15.5)
WBC: 6.6 10*3/uL (ref 4.0–10.5)
nRBC: 0 % (ref 0.0–0.2)

## 2020-02-01 LAB — SARS CORONAVIRUS 2 BY RT PCR (HOSPITAL ORDER, PERFORMED IN ~~LOC~~ HOSPITAL LAB): SARS Coronavirus 2: NEGATIVE

## 2020-02-01 LAB — TYPE AND SCREEN
ABO/RH(D): O POS
Antibody Screen: NEGATIVE

## 2020-02-01 MED ORDER — FENTANYL-BUPIVACAINE-NACL 0.5-0.125-0.9 MG/250ML-% EP SOLN
EPIDURAL | Status: AC
  Filled 2020-02-01: qty 250

## 2020-02-01 MED ORDER — TERBUTALINE SULFATE 1 MG/ML IJ SOLN: 0.2500 mg | Freq: Once | INTRAMUSCULAR | Status: DC | PRN

## 2020-02-01 MED ORDER — METHYLERGONOVINE MALEATE 0.2 MG/ML IJ SOLN
0.2000 mg | Freq: Once | INTRAMUSCULAR | Status: AC
  Administered 2020-02-01: 0.2 mg via INTRAMUSCULAR

## 2020-02-01 MED ORDER — TETANUS-DIPHTH-ACELL PERTUSSIS 5-2.5-18.5 LF-MCG/0.5 IM SUSP: 0.5000 mL | Freq: Once | INTRAMUSCULAR | Status: DC

## 2020-02-01 MED ORDER — ACETAMINOPHEN 325 MG PO TABS
650.0000 mg | ORAL_TABLET | Freq: Four times a day (QID) | ORAL | Status: DC | PRN
  Administered 2020-02-02: 650 mg via ORAL
  Filled 2020-02-01: qty 2

## 2020-02-01 MED ORDER — MISOPROSTOL 50MCG HALF TABLET: 50.0000 ug | ORAL_TABLET | ORAL | Status: DC | PRN

## 2020-02-01 MED ORDER — DIPHENHYDRAMINE HCL 50 MG/ML IJ SOLN: 12.5000 mg | INTRAMUSCULAR | Status: DC | PRN

## 2020-02-01 MED ORDER — OXYTOCIN BOLUS FROM INFUSION
333.0000 mL | Freq: Once | INTRAVENOUS | Status: AC
  Administered 2020-02-01: 333 mL via INTRAVENOUS

## 2020-02-01 MED ORDER — PRENATAL MULTIVITAMIN CH
1.0000 | ORAL_TABLET | Freq: Every day | ORAL | Status: DC
  Administered 2020-02-02 – 2020-02-03 (×2): 1 via ORAL
  Filled 2020-02-01 (×2): qty 1

## 2020-02-01 MED ORDER — IBUPROFEN 600 MG PO TABS
600.0000 mg | ORAL_TABLET | Freq: Three times a day (TID) | ORAL | Status: DC | PRN
  Administered 2020-02-01 – 2020-02-03 (×4): 600 mg via ORAL
  Filled 2020-02-01 (×4): qty 1

## 2020-02-01 MED ORDER — EPHEDRINE 5 MG/ML INJ: 10.0000 mg | INTRAVENOUS | Status: DC | PRN

## 2020-02-01 MED ORDER — METHYLERGONOVINE MALEATE 0.2 MG/ML IJ SOLN
INTRAMUSCULAR | Status: AC
  Filled 2020-02-01: qty 1

## 2020-02-01 MED ORDER — SOD CITRATE-CITRIC ACID 500-334 MG/5ML PO SOLN: 30.0000 mL | ORAL | Status: DC | PRN

## 2020-02-01 MED ORDER — LACTATED RINGERS IV SOLN: 500.0000 mL | Freq: Once | INTRAVENOUS | Status: DC

## 2020-02-01 MED ORDER — DIPHENHYDRAMINE HCL 25 MG PO CAPS: 25.0000 mg | ORAL_CAPSULE | Freq: Four times a day (QID) | ORAL | Status: DC | PRN

## 2020-02-01 MED ORDER — BENZOCAINE-MENTHOL 20-0.5 % EX AERO
1.0000 "application " | INHALATION_SPRAY | CUTANEOUS | Status: DC | PRN
  Filled 2020-02-01: qty 56

## 2020-02-01 MED ORDER — FENTANYL-BUPIVACAINE-NACL 0.5-0.125-0.9 MG/250ML-% EP SOLN: 12.0000 mL/h | EPIDURAL | Status: DC | PRN

## 2020-02-01 MED ORDER — OXYTOCIN-SODIUM CHLORIDE 30-0.9 UT/500ML-% IV SOLN
1.0000 m[IU]/min | INTRAVENOUS | Status: DC
  Administered 2020-02-01: 2 m[IU]/min via INTRAVENOUS

## 2020-02-01 MED ORDER — FENTANYL CITRATE (PF) 100 MCG/2ML IJ SOLN
INTRAMUSCULAR | Status: AC
  Administered 2020-02-01: 100 ug
  Filled 2020-02-01: qty 2

## 2020-02-01 MED ORDER — PHENYLEPHRINE 40 MCG/ML (10ML) SYRINGE FOR IV PUSH (FOR BLOOD PRESSURE SUPPORT): 80.0000 ug | PREFILLED_SYRINGE | INTRAVENOUS | Status: DC | PRN

## 2020-02-01 MED ORDER — FENTANYL CITRATE (PF) 100 MCG/2ML IJ SOLN: 100.0000 ug | Freq: Once | INTRAMUSCULAR | Status: AC

## 2020-02-01 MED ORDER — SIMETHICONE 80 MG PO CHEW: 80.0000 mg | CHEWABLE_TABLET | ORAL | Status: DC | PRN

## 2020-02-01 MED ORDER — DIBUCAINE (PERIANAL) 1 % EX OINT
1.0000 "application " | TOPICAL_OINTMENT | CUTANEOUS | Status: DC | PRN
  Administered 2020-02-02: 1 via RECTAL
  Filled 2020-02-01: qty 28

## 2020-02-01 MED ORDER — SODIUM CHLORIDE (PF) 0.9 % IJ SOLN
INTRAMUSCULAR | Status: DC | PRN
  Administered 2020-02-01: 12 mL/h via EPIDURAL

## 2020-02-01 MED ORDER — LIDOCAINE HCL (PF) 1 % IJ SOLN
INTRAMUSCULAR | Status: DC | PRN
  Administered 2020-02-01: 5 mL via EPIDURAL
  Administered 2020-02-01: 7 mL via EPIDURAL

## 2020-02-01 MED ORDER — TRANEXAMIC ACID-NACL 1000-0.7 MG/100ML-% IV SOLN
INTRAVENOUS | Status: AC
  Administered 2020-02-01: 1000 mg
  Filled 2020-02-01: qty 100

## 2020-02-01 MED ORDER — ONDANSETRON HCL 4 MG PO TABS: 4.0000 mg | ORAL_TABLET | ORAL | Status: DC | PRN

## 2020-02-01 MED ORDER — LACTATED RINGERS IV SOLN: INTRAVENOUS | Status: DC

## 2020-02-01 MED ORDER — OXYTOCIN-SODIUM CHLORIDE 30-0.9 UT/500ML-% IV SOLN
2.5000 [IU]/h | INTRAVENOUS | Status: DC
  Administered 2020-02-01: 2.5 [IU]/h via INTRAVENOUS
  Filled 2020-02-01: qty 500

## 2020-02-01 MED ORDER — ONDANSETRON HCL 4 MG/2ML IJ SOLN
4.0000 mg | Freq: Four times a day (QID) | INTRAMUSCULAR | Status: DC | PRN
  Administered 2020-02-01: 4 mg via INTRAVENOUS
  Filled 2020-02-01: qty 2

## 2020-02-01 MED ORDER — MEASLES, MUMPS & RUBELLA VAC IJ SOLR: 0.5000 mL | Freq: Once | INTRAMUSCULAR | Status: DC

## 2020-02-01 MED ORDER — ONDANSETRON HCL 4 MG/2ML IJ SOLN: 4.0000 mg | INTRAMUSCULAR | Status: DC | PRN

## 2020-02-01 MED ORDER — WITCH HAZEL-GLYCERIN EX PADS: 1.0000 "application " | MEDICATED_PAD | CUTANEOUS | Status: DC | PRN

## 2020-02-01 MED ORDER — LACTATED RINGERS IV SOLN
500.0000 mL | INTRAVENOUS | Status: DC | PRN
  Administered 2020-02-01: 500 mL via INTRAVENOUS

## 2020-02-01 MED ORDER — MEDROXYPROGESTERONE ACETATE 150 MG/ML IM SUSP: 150.0000 mg | Freq: Once | INTRAMUSCULAR | Status: DC

## 2020-02-01 MED ORDER — LIDOCAINE HCL (PF) 1 % IJ SOLN: 30.0000 mL | INTRAMUSCULAR | Status: DC | PRN

## 2020-02-01 MED ORDER — TRANEXAMIC ACID-NACL 1000-0.7 MG/100ML-% IV SOLN: 1000.0000 mg | Freq: Once | INTRAVENOUS | Status: DC

## 2020-02-01 MED ORDER — COCONUT OIL OIL
1.0000 "application " | TOPICAL_OIL | Status: DC | PRN
  Administered 2020-02-02: 1 via TOPICAL

## 2020-02-01 MED ORDER — ACETAMINOPHEN 325 MG PO TABS: 650.0000 mg | ORAL_TABLET | ORAL | Status: DC | PRN

## 2020-02-01 MED ORDER — SENNOSIDES-DOCUSATE SODIUM 8.6-50 MG PO TABS
2.0000 | ORAL_TABLET | ORAL | Status: DC
  Administered 2020-02-01 – 2020-02-02 (×2): 2 via ORAL
  Filled 2020-02-01 (×2): qty 2

## 2020-02-01 NOTE — Discharge Summary (Signed)
Postpartum Discharge Summary     Patient Name: Julie [name] DOB: 21-Jun-1991 MRN: [id number]  Date of admission: 02/01/2020 Delivery date:02/01/2020  Delivering provider: Chauncey Mann  Date of discharge: 02/03/2020  Admitting diagnosis: Encounter for induction of labor [Z34.90] Intrauterine pregnancy: [redacted]w[redacted]d    Secondary diagnosis:  Active Problems:   Encounter for induction of labor   Post-dates pregnancy  Additional problems: None    Discharge diagnosis: Term Pregnancy Delivered                                              Post partum procedures:None Augmentation: Pitocin Complications: None  Hospital course: Induction of Labor With Vaginal Delivery   29y.o. yo G3P2002 at 453w3das admitted to the hospital 02/01/2020 for induction of labor.  Indication for induction: Postdates and decel on NST in clinic.  Patient had an uncomplicated labor course as follows: Initial SVE: 4/50/-2. Patient was started on Pitocin and received epidural. She then progressed to complete. En caul delivery with fluid rupture just after delivery. Membrane Rupture Time/Date: 7:44 PM ,02/01/2020   Delivery Method:Vaginal, Spontaneous  Episiotomy: None  Lacerations:  None  Details of delivery can be found in separate delivery note. Desires Depo outpatient. Patient had a routine postpartum course. Patient is discharged home 02/03/20.  Newborn Data: Birth date:02/01/2020  Birth time:7:44 PM  Gender:Female  Living status:Living  Apgars:9 ,9  Weight:3530 g   Magnesium Sulfate received: No BMZ received: No Rhophylac:N/A MMR:N/A T-DaP:Given prenatally Flu: No Transfusion:No  Physical exam  Vitals:   02/02/20 0855 02/02/20 1320 02/02/20 1642 02/02/20 2211  BP: 109/80 121/72 103/66 123/62  Pulse: 75 86 77 69  Resp: '18 18 18   ' Temp: 98.4 F (36.9 C)  98.3 F (36.8 C)   TempSrc: Oral  Oral   SpO2: 100% 100% 100% 100%  Weight:      Height:       General: alert, cooperative  and no distress Lochia: appropriate Uterine Fundus: firm Incision: N/A DVT Evaluation: No evidence of DVT seen on physical exam. Labs: Lab Results  Component Value Date   WBC 6.6 02/01/2020   HGB 9.7 (L) 02/01/2020   HCT 32.2 (L) 02/01/2020   MCV 77.8 (L) 02/01/2020   PLT 261 02/01/2020   CMP Latest Ref Rng & Units 09/05/2015  Glucose 65 - 99 mg/dL 95  BUN 6 - 20 mg/dL 12  Creatinine 0.44 - 1.00 mg/dL 0.61  Sodium 135 - 145 mmol/L 141  Potassium 3.5 - 5.1 mmol/L 4.1  Chloride 101 - 111 mmol/L 109  CO2 22 - 32 mmol/L 23  Calcium 8.9 - 10.3 mg/dL 9.5  Total Protein 6.5 - 8.1 g/dL 7.9  Total Bilirubin 0.3 - 1.2 mg/dL 0.6  Alkaline Phos 38 - 126 U/L 62  AST 15 - 41 U/L 16  ALT 14 - 54 U/L 12(L)   Edinburgh Score: Edinburgh Postnatal Depression Scale Screening Tool 02/01/2020  I have been able to laugh and see the funny side of things. 0  I have looked forward with enjoyment to things. 0  I have blamed myself unnecessarily when things went wrong. 1  I have been anxious or worried for no good reason. 0  I have felt scared or panicky for no good reason. 0  Things have been getting on top of me. 0  I have been so unhappy that I have had difficulty sleeping. 0  I have felt sad or miserable. 1  I have been so unhappy that I have been crying. 0  The thought of harming myself has occurred to me. 0  Edinburgh Postnatal Depression Scale Total 2     After visit meds:  Allergies as of 02/03/2020   No Known Allergies     Medication List    STOP taking these medications   IRON PO   metoCLOPramide 10 MG tablet Commonly known as: REGLAN   promethazine 25 MG tablet Commonly known as: PHENERGAN     TAKE these medications   acetaminophen 325 MG tablet Commonly known as: Tylenol Take 2 tablets (650 mg total) by mouth every 6 (six) hours as needed (for pain scale < 4).   ibuprofen 600 MG tablet Commonly known as: ADVIL Take 1 tablet (600 mg total) by mouth every 8 (eight)  hours as needed for mild pain.        Discharge home in stable condition Infant Feeding: Breast Infant Disposition:home with mother Discharge instruction: per After Visit Summary and Postpartum booklet. Activity: Advance as tolerated. Pelvic rest for 6 weeks.  Diet: routine diet Future Appointments:No future appointments. Follow up Visit:  Patient to f/u with HD.   02/03/2020 Chauncey Mann, MD

## 2020-02-01 NOTE — Anesthesia Preprocedure Evaluation (Signed)
Anesthesia Evaluation  Patient identified by MRN, date of birth, ID band Patient awake    Reviewed: Allergy & Precautions, H&P , NPO status , Patient's Chart, lab work & pertinent test results  History of Anesthesia Complications Negative for: history of anesthetic complications  Airway Mallampati: II  TM Distance: >3 FB Neck ROM: full    Dental no notable dental hx. (+) Teeth Intact   Pulmonary neg pulmonary ROS,    Pulmonary exam normal breath sounds clear to auscultation       Cardiovascular hypertension, negative cardio ROS Normal cardiovascular exam Rhythm:regular Rate:Normal     Neuro/Psych negative neurological ROS  negative psych ROS   GI/Hepatic negative GI ROS, Neg liver ROS,   Endo/Other  negative endocrine ROS  Renal/GU negative Renal ROS  negative genitourinary   Musculoskeletal negative musculoskeletal ROS (+)   Abdominal (+) + obese,   Peds  Hematology  (+) anemia ,   Anesthesia Other Findings   Reproductive/Obstetrics (+) Pregnancy                             Anesthesia Physical  Anesthesia Plan  ASA: II  Anesthesia Plan: Epidural   Post-op Pain Management:    Induction:   PONV Risk Score and Plan:   Airway Management Planned:   Additional Equipment:   Intra-op Plan:   Post-operative Plan:   Informed Consent: I have reviewed the patients History and Physical, chart, labs and discussed the procedure including the risks, benefits and alternatives for the proposed anesthesia with the patient or authorized representative who has indicated his/her understanding and acceptance.       Plan Discussed with:   Anesthesia Plan Comments:         Anesthesia Quick Evaluation

## 2020-02-01 NOTE — H&P (Addendum)
OBSTETRIC ADMISSION HISTORY AND PHYSICAL  Tiasia Janeann Paisley is a 29 y.o. female G3P2002 with IUP at [redacted]w[redacted]d by first trimester [place] presenting for IOL 2/2 non-reactive NST and post-dates. She reports +FMs, No LOF, no VB, no blurry vision, headaches or peripheral edema, and RUQ pain.  She plans on breast feeding. She request depo shot for birth control. She received her prenatal care at [facility]   Dating: By first trimester [place] --->  Estimated Date of Delivery: 01/19/20  Sono:   Record unavailable  Prenatal History/Complications:  None  Past Medical History: Past Medical History:  Diagnosis Date  . Anemia   . Pregnancy induced hypertension     Past Surgical History: Past Surgical History:  Procedure Laterality Date  . NO PAST SURGERIES    . WISDOM TOOTH EXTRACTION      Obstetrical History: OB History    Gravida  3   Para  2   Term  2   Preterm      AB      Living  2     SAB      TAB      Ectopic      Multiple  0   Live Births  2           Social History Social History   Socioeconomic History  . Marital status: Married    Spouse name: Not on file  . Number of children: Not on file  . Years of education: Not on file  . Highest education level: Not on file  Occupational History  . Not on file  Tobacco Use  . Smoking status: Never Smoker  . Smokeless tobacco: Never Used  Substance and Sexual Activity  . Alcohol use: No  . Drug use: No  . Sexual activity: Yes    Birth control/protection: None  Other Topics Concern  . Not on file  Social History Narrative  . Not on file   Social Determinants of Health   Financial Resource Strain:   . Difficulty of Paying Living Expenses:   Food Insecurity:   . Worried About Programme researcher, broadcasting/film/video in the Last Year:   . Barista in the Last Year:   Transportation Needs:   . Freight forwarder (Medical):   Marland Kitchen Lack of Transportation (Non-Medical):   Physical Activity:   . Days of Exercise per  Week:   . Minutes of Exercise per Session:   Stress:   . Feeling of Stress :   Social Connections:   . Frequency of Communication with Friends and Family:   . Frequency of Social Gatherings with Friends and Family:   . Attends Religious Services:   . Active Member of Clubs or Organizations:   . Attends Banker Meetings:   Marland Kitchen Marital Status:     Family History: Family History  Problem Relation Age of Onset  . Diabetes Paternal Aunt     Allergies: No Known Allergies  Medications Prior to Admission  Medication Sig Dispense Refill Last Dose  . metoCLOPramide (REGLAN) 10 MG tablet Take 1 tablet (10 mg total) by mouth 3 (three) times daily before meals. 30 tablet 2   . Prenatal Vit-Fe Fumarate-FA (PRENATAL VITAMIN PO) Take by mouth.     . promethazine (PHENERGAN) 25 MG tablet Take 0.5-1 tablets (12.5-25 mg total) by mouth at bedtime. 30 tablet 2      Review of Systems   All systems reviewed and negative except as stated in  HPI  Height 5\' 2"  (1.575 m), weight 94.9 kg, last menstrual period 04/08/2019, unknown if currently breastfeeding. General appearance: alert, cooperative, appears stated age and no distress Lungs: clear to auscultation bilaterally Heart: regular rate and rhythm Abdomen: soft, non-tender; bowel sounds normal Pelvic: 4/50/-3 Extremities: Homans sign is negative, no sign of DVT Presentation: cephalic Fetal monitoringBaseline: 115 bpm, Variability: Good {> 6 bpm), Accelerations: Reactive and Decelerations: Absent Uterine activity: irregular  Dilation: 4 Effacement (%): 50 Station: -2 Exam by:: [email or web address]  Prenatal labs: ABO, Rh:  O+ Antibody:  neg Rubella:  immune RPR:   nonreactive HBsAg:   nonreactive HIV: NON REACTIVE (11/21 1130)  GBS:  Neg  1 hr Glucola normal Genetic screening  normal Anatomy 02-15-2003 Not available  Prenatal Transfer Tool  Maternal Diabetes: No Genetic Screening: Normal Maternal Ultrasounds/Referrals: Other: Not  available  Fetal Ultrasounds or other Referrals:  None Maternal Substance Abuse:  No Significant Maternal Medications:  None Significant Maternal Lab Results: Group B Strep negative  No results found for this or any previous visit (from the past 24 hour(s)).  Patient Active Problem List   Diagnosis Date Noted  . Encounter for induction of labor 02/01/2020  . Normal labor 08/15/2017  . Vaginal delivery 08/15/2017    Assessment/Plan:  Rheba Diamond is a 29 y.o. G3P2002 at [redacted]w[redacted]d here for IOL 2/2 non-reactive NST and post dates.    #Labor:Cervix favorable, will start Pitocin, consider AROM when appropriate. #Pain: Per patient request  #FWB: Category I #ID:  GBS neg #MOF: Breast #MOC:Depo shot  Anticipate vaginal delivery.  [redacted]w[redacted]d, MD 02/01/20, 3:30 PM  GME ATTESTATION:  I saw and evaluated the patient. I agree with the findings and the plan of care as documented in the resident's note.  02/03/20, DO OB Fellow, Faculty [facility], Center for [facility] 02/01/2020 3:38 PM

## 2020-02-01 NOTE — Anesthesia Procedure Notes (Signed)
Epidural Patient location during procedure: OB Start time: 02/01/2020 6:47 PM End time: 02/01/2020 6:49 PM  Staffing Anesthesiologist: Leilani Able, MD Performed: anesthesiologist   Preanesthetic Checklist Completed: patient identified, IV checked, site marked, risks and benefits discussed, surgical consent, monitors and equipment checked, pre-op evaluation and timeout performed  Epidural Patient position: sitting Prep: DuraPrep and site prepped and draped Patient monitoring: continuous pulse ox and blood pressure Approach: midline Location: L3-L4 Injection technique: LOR air  Needle:  Needle type: Tuohy  Needle gauge: 17 G Needle length: 9 cm and 9 Needle insertion depth: 5 cm cm Catheter type: closed end flexible Catheter size: 19 Gauge Catheter at skin depth: 10 cm Test dose: negative  Assessment Events: blood not aspirated, injection not painful, no injection resistance, no paresthesia and negative IV test  Additional Notes Reason for block:procedure for pain

## 2020-02-01 NOTE — Consult Note (Signed)
NICU delivery team called to attend this vaginal delivery.  Infant came out crying vigorously so delivery team was dismissed.   Overton Mam, MD (Attending Neonatologist)

## 2020-02-02 DIAGNOSIS — O99893 Other specified diseases and conditions complicating puerperium: Secondary | ICD-10-CM

## 2020-02-02 DIAGNOSIS — R112 Nausea with vomiting, unspecified: Secondary | ICD-10-CM

## 2020-02-02 DIAGNOSIS — D649 Anemia, unspecified: Secondary | ICD-10-CM

## 2020-02-02 DIAGNOSIS — O9903 Anemia complicating the puerperium: Secondary | ICD-10-CM

## 2020-02-02 LAB — RPR: RPR Ser Ql: NONREACTIVE

## 2020-02-02 MED ORDER — SODIUM CHLORIDE 0.9 % IV SOLN
510.0000 mg | Freq: Once | INTRAVENOUS | Status: AC
  Administered 2020-02-02: 510 mg via INTRAVENOUS
  Filled 2020-02-02: qty 17

## 2020-02-02 NOTE — Progress Notes (Addendum)
Post Partum Day 1 Subjective: no complaints, voiding, tolerating PO and She reports pain of 2/10. She vomited once overnight after a medication she took. Lochia same as usual menstrual cycle. She is breast feeding without difficulty. She would like a depo shot.   Objective: Blood pressure 127/84, pulse 69, temperature 98.4 F (36.9 C), temperature source Oral, resp. rate 18, height 5\' 2"  (1.575 m), weight 94.9 kg, last menstrual period 04/08/2019, SpO2 100 %.  Physical Exam:  General: alert, cooperative, appears stated age and no distress Lochia: appropriate Uterine Fundus: firm DVT Evaluation: No evidence of DVT seen on physical exam. Negative Homan's sign. No cords or calf tenderness. No significant calf/ankle edema.  Recent Labs    02/01/20 1506  HGB 9.7*  HCT 32.2*    Assessment/Plan: Plan for discharge tomorrow, Breastfeeding and Contraception Depo shot. Zofran prn for nausea and vomiting.    LOS: 1 day   02/03/20 02/02/2020, 7:39 AM   I saw and evaluated the patient. I agree with the findings and the plan of care as documented in the resident's note. Will give IV iron for baseline anemia.   02/04/2020, MD [facility] Family Medicine Fellow, [facility]

## 2020-02-02 NOTE — Anesthesia Postprocedure Evaluation (Signed)
Anesthesia Post Note  Patient: Julie [name]  Procedure(s) Performed: AN AD HOC LABOR EPIDURAL     Patient location during evaluation: Mother Baby Anesthesia Type: Epidural Level of consciousness: awake and alert Pain management: pain level controlled Vital Signs Assessment: post-procedure vital signs reviewed and stable Respiratory status: spontaneous breathing, nonlabored ventilation and respiratory function stable Cardiovascular status: stable Postop Assessment: no headache, no backache and epidural receding Anesthetic complications: no   No complications documented.  Last Vitals:  Vitals:   02/01/20 2343 02/02/20 0457  BP: 121/79 127/84  Pulse: 71 69  Resp: 18 18  Temp: 36.7 C 36.9 C  SpO2: 99% 100%    Last Pain:  Vitals:   02/02/20 0457  TempSrc: Oral  PainSc: 7    Pain Goal:                   Junious Silk

## 2020-02-03 MED ORDER — IBUPROFEN 600 MG PO TABS: 600.0000 mg | ORAL_TABLET | Freq: Three times a day (TID) | ORAL | 0 refills | Status: DC | PRN

## 2020-02-03 MED ORDER — ACETAMINOPHEN 325 MG PO TABS: 650.0000 mg | ORAL_TABLET | Freq: Four times a day (QID) | ORAL | 0 refills | Status: DC | PRN

## 2020-02-03 NOTE — Discharge Instructions (Signed)

## 2020-02-14 DIAGNOSIS — Z419 Encounter for procedure for purposes other than remedying health state, unspecified: Secondary | ICD-10-CM | POA: Diagnosis not present

## 2020-03-14 DIAGNOSIS — Z30017 Encounter for initial prescription of implantable subdermal contraceptive: Secondary | ICD-10-CM | POA: Diagnosis not present

## 2020-03-14 DIAGNOSIS — Z3200 Encounter for pregnancy test, result unknown: Secondary | ICD-10-CM | POA: Diagnosis not present

## 2020-03-16 DIAGNOSIS — Z419 Encounter for procedure for purposes other than remedying health state, unspecified: Secondary | ICD-10-CM | POA: Diagnosis not present

## 2020-03-18 ENCOUNTER — Ambulatory Visit: Payer: Medicaid Other | Admitting: Family Medicine

## 2020-03-22 DIAGNOSIS — L720 Epidermal cyst: Secondary | ICD-10-CM | POA: Diagnosis not present

## 2020-04-15 DIAGNOSIS — Z419 Encounter for procedure for purposes other than remedying health state, unspecified: Secondary | ICD-10-CM | POA: Diagnosis not present

## 2020-05-16 DIAGNOSIS — Z419 Encounter for procedure for purposes other than remedying health state, unspecified: Secondary | ICD-10-CM | POA: Diagnosis not present

## 2020-06-15 DIAGNOSIS — Z419 Encounter for procedure for purposes other than remedying health state, unspecified: Secondary | ICD-10-CM | POA: Diagnosis not present

## 2020-07-11 ENCOUNTER — Other Ambulatory Visit: Payer: Medicaid Other

## 2020-07-11 DIAGNOSIS — Z20822 Contact with and (suspected) exposure to covid-19: Secondary | ICD-10-CM | POA: Diagnosis not present

## 2020-07-12 DIAGNOSIS — Z20822 Contact with and (suspected) exposure to covid-19: Secondary | ICD-10-CM | POA: Diagnosis not present

## 2020-07-12 LAB — SARS-COV-2, NAA 2 DAY TAT

## 2020-07-12 LAB — NOVEL CORONAVIRUS, NAA: SARS-CoV-2, NAA: NOT DETECTED

## 2020-07-16 DIAGNOSIS — Z419 Encounter for procedure for purposes other than remedying health state, unspecified: Secondary | ICD-10-CM | POA: Diagnosis not present

## 2020-08-16 DIAGNOSIS — Z419 Encounter for procedure for purposes other than remedying health state, unspecified: Secondary | ICD-10-CM | POA: Diagnosis not present

## 2020-09-13 DIAGNOSIS — Z419 Encounter for procedure for purposes other than remedying health state, unspecified: Secondary | ICD-10-CM | POA: Diagnosis not present

## 2020-10-14 DIAGNOSIS — Z419 Encounter for procedure for purposes other than remedying health state, unspecified: Secondary | ICD-10-CM | POA: Diagnosis not present

## 2020-11-13 DIAGNOSIS — Z419 Encounter for procedure for purposes other than remedying health state, unspecified: Secondary | ICD-10-CM | POA: Diagnosis not present

## 2020-12-14 DIAGNOSIS — Z419 Encounter for procedure for purposes other than remedying health state, unspecified: Secondary | ICD-10-CM | POA: Diagnosis not present

## 2020-12-22 ENCOUNTER — Other Ambulatory Visit: Payer: Self-pay

## 2020-12-22 ENCOUNTER — Encounter (HOSPITAL_COMMUNITY): Payer: Self-pay

## 2020-12-22 ENCOUNTER — Ambulatory Visit (HOSPITAL_COMMUNITY)
Admission: EM | Admit: 2020-12-22 | Discharge: 2020-12-22 | Disposition: A | Discharge status: Home / Self Care | Payer: Medicaid Other | Attending: Physician Assistant | Admitting: Physician Assistant

## 2020-12-22 DIAGNOSIS — H9201 Otalgia, right ear: Secondary | ICD-10-CM | POA: Diagnosis not present

## 2020-12-22 DIAGNOSIS — H6121 Impacted cerumen, right ear: Secondary | ICD-10-CM

## 2020-12-22 DIAGNOSIS — H66001 Acute suppurative otitis media without spontaneous rupture of ear drum, right ear: Secondary | ICD-10-CM | POA: Diagnosis not present

## 2020-12-22 DIAGNOSIS — J3489 Other specified disorders of nose and nasal sinuses: Secondary | ICD-10-CM

## 2020-12-22 MED ORDER — AMOXICILLIN-POT CLAVULANATE 875-125 MG PO TABS
1.0000 | ORAL_TABLET | Freq: Two times a day (BID) | ORAL | 0 refills | Status: DC
Start: 2020-12-22 — End: 2021-09-26

## 2020-12-22 NOTE — ED Provider Notes (Addendum)
[facility]    CSN: [id number] Arrival date & time: 12/22/20  1058      History   Chief Complaint Chief Complaint  Patient presents with   Sore Throat   Headache   Otalgia    HPI Julie [name] is a 30 y.o. female.   Patient presents today with a 1 day history of right otalgia.  She reports associated nasal congestion and headache with sinus pressure.  She denies any otorrhea, change in hearing, nausea, vomiting, dizziness, syncope, cough, shortness of breath, chest pain.  She has tried Benadryl with improvement but not resolution of symptoms.  Denies any known sick contacts but does report her daughter has had a runny nose.  She denies any past medical history including asthma, allergies, smoking.  She denies any recent antibiotic use.  Reports she is up-to-date on COVID-19 vaccination but has not had booster.  She has had her flu shot.  She denies any recent swimming or airplane travel.   Past Medical History:  Diagnosis Date   Anemia    Pregnancy induced hypertension     Patient Active Problem List   Diagnosis Date Noted   Encounter for induction of labor 02/01/2020   Post-dates pregnancy 02/01/2020   Normal labor 08/15/2017   Vaginal delivery 08/15/2017    Past Surgical History:  Procedure Laterality Date   NO PAST SURGERIES     WISDOM TOOTH EXTRACTION      OB History     Gravida  3   Para  3   Term  3   Preterm      AB      Living  3      SAB      IAB      Ectopic      Multiple  0   Live Births  3            Home Medications    Prior to Admission medications   Medication Sig Start Date End Date Taking? Authorizing Provider  amoxicillin-clavulanate (AUGMENTIN) 875-125 MG tablet Take 1 tablet by mouth every 12 (twelve) hours. 12/22/20  Yes Lilla Callejo, Noberto Retort, PA-C  acetaminophen (TYLENOL) 325 MG tablet Take 2 tablets (650 mg total) by mouth every 6 (six) hours as needed (for pain scale < 4). 02/03/20   Fair, Hoyle Sauer, MD  ibuprofen (ADVIL) 600 MG tablet Take 1 tablet (600 mg total) by mouth every 8 (eight) hours as needed for mild pain. 02/03/20   Joselyn Arrow, MD    Family History Family History  Problem Relation Age of Onset   Diabetes Paternal Aunt     Social History Social History   Tobacco Use   Smoking status: Never   Smokeless tobacco: Never  Substance Use Topics   Alcohol use: No   Drug use: No     Allergies   Patient has no known allergies.   Review of Systems Review of Systems  Constitutional:  Negative for activity change, appetite change, fatigue and fever.  HENT:  Positive for congestion, ear pain and sinus pressure. Negative for sneezing and sore throat.   Respiratory:  Negative for cough and shortness of breath.   Cardiovascular:  Negative for chest pain.  Gastrointestinal:  Negative for abdominal pain, diarrhea, nausea and vomiting.  Neurological:  Positive for headaches. Negative for dizziness and light-headedness.    Physical Exam Triage Vital Signs ED Triage Vitals  Enc Vitals Group     BP 12/22/20  1211 118/74     Pulse Rate 12/22/20 1211 71     Resp 12/22/20 1211 17     Temp 12/22/20 1211 97.9 F (36.6 C)     Temp Source 12/22/20 1211 Oral     SpO2 12/22/20 1211 100 %     Weight --      Height --      Head Circumference --      Peak Flow --      Pain Score 12/22/20 1210 6     Pain Loc --      Pain Edu? --      Excl. in [place]? --    No data found.  Updated Vital Signs BP 118/74 (BP Location: Right Arm)   Pulse 71   Temp 97.9 F (36.6 C) (Oral)   Resp 17   SpO2 100%   Breastfeeding Yes   Visual Acuity Right Eye Distance:   Left Eye Distance:   Bilateral Distance:    Right Eye Near:   Left Eye Near:    Bilateral Near:     Physical Exam Vitals reviewed.  Constitutional:      General: She is awake. She is not in acute distress.    Appearance: Normal appearance. She is normal weight. She is not ill-appearing.     Comments: Very  pleasant female appears stated age in no acute distress  HENT:     Head: Normocephalic and atraumatic.     Right Ear: Ear canal and external ear normal. Tympanic membrane is erythematous and bulging.     Left Ear: Tympanic membrane, ear canal and external ear normal. Tympanic membrane is not erythematous or bulging.     Ears:     Comments: Right ear: No tenderness palpation of tragus or pain with manipulation of external ear.  Cerumen impaction noted on exam which resolved with in office irrigation revealing erythematous and bulging TM.    Nose:     Right Sinus: No maxillary sinus tenderness or frontal sinus tenderness.     Left Sinus: No maxillary sinus tenderness or frontal sinus tenderness.     Mouth/Throat:     Pharynx: Uvula midline. No oropharyngeal exudate or posterior oropharyngeal erythema.  Cardiovascular:     Rate and Rhythm: Normal rate and regular rhythm.     Heart sounds: Normal heart sounds, S1 normal and S2 normal. No murmur heard. Pulmonary:     Effort: Pulmonary effort is normal.     Breath sounds: Normal breath sounds. No wheezing, rhonchi or rales.     Comments: Clear to auscultation bilaterally Musculoskeletal:     Cervical back: Normal range of motion and neck supple.  Lymphadenopathy:     Head:     Right side of head: No submental, submandibular or tonsillar adenopathy.     Left side of head: No submental, submandibular or tonsillar adenopathy.     Cervical: No cervical adenopathy.  Psychiatric:        Behavior: Behavior is cooperative.     UC Treatments / Results  Labs (all labs ordered are listed, but only abnormal results are displayed) Labs Reviewed - No data to display  EKG   Radiology No results found.  Procedures Procedures (including critical care time)  Medications Ordered in UC Medications - No data to display  Initial Impression / Assessment and Plan / UC Course  I have reviewed the triage vital signs and the nursing  notes.  Pertinent labs & imaging results that were available during  my care of the patient were reviewed by me and considered in my medical decision making (see chart for details).     Cerumen successfully removed with an office irrigation revealing erythematous and bulging TM consistent with otitis media.  Patient was started on Augmentin twice daily for 7 days.  She can use over-the-counter analgesics for pain relief.  Recommended she avoid putting anything in ear canal including earbuds or earplugs as well as Q-tips.  Discussed alarm symptoms that warrant emergent evaluation.  Recommended she follow-up with primary care provider within 1 week to ensure resolution of infection.  Strict return precautions given to which patient expressed understanding.   Final Clinical Impressions(s) / UC Diagnoses   Final diagnoses:  Acute otalgia, right  Impacted cerumen of right ear  Sinus pressure  Non-recurrent acute suppurative otitis media of right ear without spontaneous rupture of tympanic membrane     Discharge Instructions      Take Augmentin twice a day to cover for your infection.  You can use Tylenol for pain relief.  If anything worsens please return for reevaluation.  I recommend you follow-up with your primary care provider within 1 week to ensure resolution of infection.     ED Prescriptions     Medication Sig Dispense Auth. Provider   amoxicillin-clavulanate (AUGMENTIN) 875-125 MG tablet Take 1 tablet by mouth every 12 (twelve) hours. 14 tablet Gracyn Santillanes, Noberto Retort, PA-C      PDMP not reviewed this encounter.   Jeani Hawking, PA-C 12/22/20 1327    Caytlin Better, Noberto Retort, PA-C 12/22/20 1328

## 2020-12-22 NOTE — ED Triage Notes (Signed)
Pt in with c/o ST, right ear pain and headache that started today   Pt took benadryl with relief

## 2020-12-22 NOTE — Discharge Instructions (Addendum)
Take Augmentin twice a day to cover for your infection.  You can use Tylenol for pain relief.  If anything worsens please return for reevaluation.  I recommend you follow-up with your primary care provider within 1 week to ensure resolution of infection.

## 2021-01-10 ENCOUNTER — Other Ambulatory Visit: Payer: Medicaid Other

## 2021-01-12 ENCOUNTER — Ambulatory Visit: Payer: Medicaid Other | Attending: Critical Care Medicine

## 2021-01-12 DIAGNOSIS — Z20822 Contact with and (suspected) exposure to covid-19: Secondary | ICD-10-CM

## 2021-01-13 DIAGNOSIS — Z419 Encounter for procedure for purposes other than remedying health state, unspecified: Secondary | ICD-10-CM | POA: Diagnosis not present

## 2021-01-13 LAB — SARS-COV-2, NAA 2 DAY TAT

## 2021-01-13 LAB — NOVEL CORONAVIRUS, NAA: SARS-CoV-2, NAA: NOT DETECTED

## 2021-02-13 DIAGNOSIS — Z419 Encounter for procedure for purposes other than remedying health state, unspecified: Secondary | ICD-10-CM | POA: Diagnosis not present

## 2021-03-14 DIAGNOSIS — Z3200 Encounter for pregnancy test, result unknown: Secondary | ICD-10-CM | POA: Diagnosis not present

## 2021-03-14 DIAGNOSIS — Z113 Encounter for screening for infections with a predominantly sexual mode of transmission: Secondary | ICD-10-CM | POA: Diagnosis not present

## 2021-03-14 DIAGNOSIS — Z3046 Encounter for surveillance of implantable subdermal contraceptive: Secondary | ICD-10-CM | POA: Diagnosis not present

## 2021-03-14 DIAGNOSIS — Z30011 Encounter for initial prescription of contraceptive pills: Secondary | ICD-10-CM | POA: Diagnosis not present

## 2021-03-16 DIAGNOSIS — Z419 Encounter for procedure for purposes other than remedying health state, unspecified: Secondary | ICD-10-CM | POA: Diagnosis not present

## 2021-04-15 DIAGNOSIS — Z419 Encounter for procedure for purposes other than remedying health state, unspecified: Secondary | ICD-10-CM | POA: Diagnosis not present

## 2021-05-16 DIAGNOSIS — Z419 Encounter for procedure for purposes other than remedying health state, unspecified: Secondary | ICD-10-CM | POA: Diagnosis not present

## 2021-05-17 DIAGNOSIS — Z3046 Encounter for surveillance of implantable subdermal contraceptive: Secondary | ICD-10-CM | POA: Diagnosis not present

## 2021-06-15 DIAGNOSIS — Z419 Encounter for procedure for purposes other than remedying health state, unspecified: Secondary | ICD-10-CM | POA: Diagnosis not present

## 2021-07-16 DIAGNOSIS — Z419 Encounter for procedure for purposes other than remedying health state, unspecified: Secondary | ICD-10-CM | POA: Diagnosis not present

## 2021-08-16 DIAGNOSIS — Z419 Encounter for procedure for purposes other than remedying health state, unspecified: Secondary | ICD-10-CM | POA: Diagnosis not present

## 2021-09-13 DIAGNOSIS — Z419 Encounter for procedure for purposes other than remedying health state, unspecified: Secondary | ICD-10-CM | POA: Diagnosis not present

## 2021-09-26 ENCOUNTER — Other Ambulatory Visit: Payer: Self-pay

## 2021-09-26 ENCOUNTER — Ambulatory Visit (HOSPITAL_COMMUNITY)
Admission: EM | Admit: 2021-09-26 | Discharge: 2021-09-26 | Disposition: A | Discharge status: Home / Self Care | Payer: Medicaid Other

## 2021-09-26 ENCOUNTER — Inpatient Hospital Stay (HOSPITAL_COMMUNITY): Payer: Medicaid Other

## 2021-09-26 ENCOUNTER — Inpatient Hospital Stay (HOSPITAL_COMMUNITY)
Admission: AD | Admit: 2021-09-26 | Discharge: 2021-09-26 | Disposition: A | Discharge status: Home / Self Care | Payer: Medicaid Other | Attending: Obstetrics and Gynecology | Admitting: Obstetrics and Gynecology

## 2021-09-26 ENCOUNTER — Encounter (HOSPITAL_COMMUNITY): Payer: Self-pay | Admitting: Obstetrics and Gynecology

## 2021-09-26 DIAGNOSIS — R110 Nausea: Secondary | ICD-10-CM | POA: Diagnosis not present

## 2021-09-26 DIAGNOSIS — Z3A01 Less than 8 weeks gestation of pregnancy: Secondary | ICD-10-CM | POA: Diagnosis not present

## 2021-09-26 DIAGNOSIS — R109 Unspecified abdominal pain: Secondary | ICD-10-CM | POA: Diagnosis not present

## 2021-09-26 DIAGNOSIS — O3680X0 Pregnancy with inconclusive fetal viability, not applicable or unspecified: Secondary | ICD-10-CM | POA: Diagnosis not present

## 2021-09-26 DIAGNOSIS — O26891 Other specified pregnancy related conditions, first trimester: Secondary | ICD-10-CM | POA: Diagnosis not present

## 2021-09-26 DIAGNOSIS — O26899 Other specified pregnancy related conditions, unspecified trimester: Secondary | ICD-10-CM

## 2021-09-26 DIAGNOSIS — O2691 Pregnancy related conditions, unspecified, first trimester: Secondary | ICD-10-CM | POA: Diagnosis not present

## 2021-09-26 DIAGNOSIS — Z3A00 Weeks of gestation of pregnancy not specified: Secondary | ICD-10-CM | POA: Diagnosis not present

## 2021-09-26 DIAGNOSIS — Z3A08 8 weeks gestation of pregnancy: Secondary | ICD-10-CM | POA: Diagnosis not present

## 2021-09-26 DIAGNOSIS — O26851 Spotting complicating pregnancy, first trimester: Secondary | ICD-10-CM | POA: Diagnosis not present

## 2021-09-26 DIAGNOSIS — Z3491 Encounter for supervision of normal pregnancy, unspecified, first trimester: Secondary | ICD-10-CM

## 2021-09-26 LAB — CBC
HCT: 33.5 % — ABNORMAL LOW (ref 36.0–46.0)
Hemoglobin: 10.9 g/dL — ABNORMAL LOW (ref 12.0–15.0)
MCH: 24.1 pg — ABNORMAL LOW (ref 26.0–34.0)
MCHC: 32.5 g/dL (ref 30.0–36.0)
MCV: 74.1 fL — ABNORMAL LOW (ref 80.0–100.0)
Platelets: 310 10*3/uL (ref 150–400)
RBC: 4.52 MIL/uL (ref 3.87–5.11)
RDW: 14.5 % (ref 11.5–15.5)
WBC: 6.2 10*3/uL (ref 4.0–10.5)
nRBC: 0 % (ref 0.0–0.2)

## 2021-09-26 LAB — HCG, QUANTITATIVE, PREGNANCY: hCG, Beta Chain, Quant, S: 16879 m[IU]/mL — ABNORMAL HIGH (ref <5)

## 2021-09-26 LAB — WET PREP, GENITAL
Clue Cells Wet Prep HPF POC: NONE SEEN
Sperm: NONE SEEN
Trich, Wet Prep: NONE SEEN
WBC, Wet Prep HPF POC: >=10 — AB (ref <10)

## 2021-09-26 LAB — URINALYSIS, ROUTINE W REFLEX MICROSCOPIC
Bilirubin Urine: NEGATIVE
Glucose, UA: NEGATIVE mg/dL
Hgb urine dipstick: NEGATIVE
Ketones, ur: NEGATIVE mg/dL
Leukocytes,Ua: NEGATIVE
Nitrite: NEGATIVE
Protein, ur: NEGATIVE mg/dL
Specific Gravity, Urine: 1.027 (ref 1.005–1.030)
pH: 8 (ref 5.0–8.0)

## 2021-09-26 LAB — POCT PREGNANCY, URINE: Preg Test, Ur: POSITIVE — AB

## 2021-09-26 NOTE — MAU Note (Signed)
...  Julie [name] is a 31 y.o. at [redacted]w[redacted]d here in MAU reporting: Right upper abdominal pain that has been intermittent since last Thursday. Patient states she experienced an episode of vaginal spotting this past Saturday but has not had any bleeding since then. Last IC this past Sunday. Denies VB or LOF.  ? ?Pain score:  ?7/10 right upper quadrant pain ? ? ?

## 2021-09-26 NOTE — ED Triage Notes (Signed)
Pt is 2 months pregnant. ?

## 2021-09-26 NOTE — ED Notes (Signed)
Patient is being discharged from the Urgent Care and sent to the Emergency Department via POV . Per NP, patient is in need of higher level of care due to abdominal cramping in pregnancy. Patient is aware and verbalizes understanding of plan of care.  ?Vitals:  ? 09/26/21 1424  ?BP: 126/82  ?Pulse: 72  ?Resp: 20  ?Temp: 98 ?F (36.7 ?C)  ?SpO2: 99%  ?  ?

## 2021-09-26 NOTE — MAU Provider Note (Signed)
?History  ?  ? ?CSN: [id number] ? ?Arrival date and time: 09/26/21 1438 ? ? Event Date/Time  ? First Provider Initiated Contact with Patient 09/26/21 1715   ?  ? ?Chief Complaint  ?Patient presents with  ? Abdominal Pain  ? ?31 y.o. [id number] @[redacted]w[redacted]d  by sure LMP presenting with abdominal pain and spotting. Reports onset of pain 1 week ago. Pain is intermittent, bilateral and lower. Rates pain 7/10. Reports nausea but no vomiting. Had diarrhea this am. Denies urinary sx other than frequency. Had pink spotting 3 days ago.  ? ? ?OB History   ? ? Gravida  ?4  ? Para  ?3  ? Term  ?3  ? Preterm  ?   ? AB  ?   ? Living  ?3  ?  ? ? SAB  ?   ? IAB  ?   ? Ectopic  ?   ? Multiple  ?0  ? Live Births  ?3  ?   ?  ?  ? ? ?Past Medical History:  ?Diagnosis Date  ? Anemia   ? Pregnancy induced hypertension   ? ? ?Past Surgical History:  ?Procedure Laterality Date  ? NO PAST SURGERIES    ? WISDOM TOOTH EXTRACTION    ? ? ?Family History  ?Problem Relation Age of Onset  ? Diabetes Paternal Aunt   ? ? ?Social History  ? ?Tobacco Use  ? Smoking status: Never  ? Smokeless tobacco: Never  ?Substance Use Topics  ? Alcohol use: No  ? Drug use: No  ? ? ?Allergies: No Known Allergies ? ?No medications prior to admission.  ? ? ?Review of Systems  ?Constitutional:  Negative for chills and fever.  ?Gastrointestinal:  Positive for abdominal pain, diarrhea and nausea. Negative for constipation and vomiting.  ?Genitourinary:  Positive for frequency and vaginal bleeding. Negative for dysuria, hematuria, urgency and vaginal discharge.  ?Physical Exam  ? ?Blood pressure 115/71, pulse 76, temperature 98.1 ?F (36.7 ?C), temperature source Oral, resp. rate 19, height 5\' 2"  (1.575 m), weight 92.4 kg, last menstrual period 07/30/2021, SpO2 99 %, currently breastfeeding. ? ?Physical Exam ?Vitals and nursing note reviewed.  ?Constitutional:   ?   General: She is not in acute distress. ?   Appearance: Normal appearance.  ?HENT:  ?   Head: Normocephalic and  atraumatic.  ?Cardiovascular:  ?   Rate and Rhythm: Normal rate.  ?Pulmonary:  ?   Effort: Pulmonary effort is normal. No respiratory distress.  ?Abdominal:  ?   General: There is no distension.  ?   Palpations: Abdomen is soft. There is no mass.  ?   Tenderness: There is no abdominal tenderness. There is no guarding or rebound.  ?   Hernia: No hernia is present.  ?Musculoskeletal:     ?   General: Normal range of motion.  ?   Cervical back: Normal range of motion.  ?Skin: ?   General: Skin is warm and dry.  ?Neurological:  ?   General: No focal deficit present.  ?   Mental Status: She is alert and oriented to person, place, and time.  ?Psychiatric:     ?   Mood and Affect: Mood normal.     ?   Behavior: Behavior normal.  ? ?Results for orders placed or performed during the hospital encounter of 09/26/21 (from the past 24 hour(s))  ?Pregnancy, urine POC     Status: Abnormal  ? Collection Time: 09/26/21  3:14 PM  ?Result  Value Ref Range  ? Preg Test, Ur POSITIVE (A) NEGATIVE  ?Urinalysis, Routine w reflex microscopic Urine, Clean Catch     Status: None  ? Collection Time: 09/26/21  3:23 PM  ?Result Value Ref Range  ? Color, Urine YELLOW YELLOW  ? APPearance CLEAR CLEAR  ? Specific Gravity, Urine 1.027 1.005 - 1.030  ? pH 8.0 5.0 - 8.0  ? Glucose, UA NEGATIVE NEGATIVE mg/dL  ? Hgb urine dipstick NEGATIVE NEGATIVE  ? Bilirubin Urine NEGATIVE NEGATIVE  ? Ketones, ur NEGATIVE NEGATIVE mg/dL  ? Protein, ur NEGATIVE NEGATIVE mg/dL  ? Nitrite NEGATIVE NEGATIVE  ? Leukocytes,Ua NEGATIVE NEGATIVE  ?CBC     Status: Abnormal  ? Collection Time: 09/26/21  3:41 PM  ?Result Value Ref Range  ? WBC 6.2 4.0 - 10.5 K/uL  ? RBC 4.52 3.87 - 5.11 MIL/uL  ? Hemoglobin 10.9 (L) 12.0 - 15.0 g/dL  ? HCT 33.5 (L) 36.0 - 46.0 %  ? MCV 74.1 (L) 80.0 - 100.0 fL  ? MCH 24.1 (L) 26.0 - 34.0 pg  ? MCHC 32.5 30.0 - 36.0 g/dL  ? RDW 14.5 11.5 - 15.5 %  ? Platelets 310 150 - 400 K/uL  ? nRBC 0.0 0.0 - 0.2 %  ?hCG, quantitative, pregnancy     Status:  Abnormal  ? Collection Time: 09/26/21  3:41 PM  ?Result Value Ref Range  ? hCG, Beta Chain, Quant, S 16,879 (H) <5 mIU/mL  ?Wet prep, genital     Status: Abnormal  ? Collection Time: 09/26/21  4:16 PM  ? Specimen: PATH Cytology Cervicovaginal Ancillary Only  ?Result Value Ref Range  ? Yeast Wet Prep HPF POC PRESENT (A) NONE SEEN  ? Trich, Wet Prep NONE SEEN NONE SEEN  ? Clue Cells Wet Prep HPF POC NONE SEEN NONE SEEN  ? WBC, Wet Prep HPF POC >=10 (A) <10  ? Sperm NONE SEEN   ? ?US OB LESS THAN 14 WEEKS WITH OB TRANSVAGINAL ? ?Result Date: 09/26/2021 ?CLINICAL DATA:  Abdominal pain EXAM: OBSTETRIC <14 WK US AND TRANSVAGINAL OB US TECHNIQUE: Both transabdominal and transvaginal ultrasound examinations were performed for complete evaluation of the gestation as well as the maternal uterus, adnexal regions, and pelvic cul-de-sac. Transvaginal technique was performed to assess early pregnancy. COMPARISON:  None. FINDINGS: Intrauterine gestational sac: Single Yolk sac:  Not Visualized. Embryo:  Not Visualized. Cardiac Activity: Not Visualized. Heart Rate:   bpm MSD: 11.4 mm   5 w   6 d CRL:    mm    w    d                  US EDC: Subchorionic hemorrhage:  None visualized. Maternal uterus/adnexae: No adnexal mass or abnormal free fluid. IMPRESSION: Early intrauterine gestational sac, 5 weeks 6 days by mean sac diameter. No yolk sac or fetal pole currently. This could be followed with repeat ultrasound in 10-14 days to ensure expected progression. No acute maternal findings. Electronically Signed   By: Charlett NoseKevin  Dover M.D.   On: 09/26/2021 18:13   ? ?MAU Course  ?Procedures ? ?MDM ?Labs and US ordered and reviewed. IUGS but no YS or FP seen on US, findings could indicate early pregnancy, ectopic pregnancy, or failed pregnancy, discussed with pt. Will follow quant after 48 hrs. Stable for discharge home.  ? ?Assessment and Plan  ? ?1. Pregnancy, location unknown   ?2. Abdominal pain in pregnancy   ? ?Discharge home ?Follow up at  [facility] on 09/29/21 ?SAB/ectopic precautions ? ?Allergies as of 09/26/2021   ?No Known Allergies ?  ? ?  ?Medication List  ?  ? ?STOP taking these medications   ? ?amoxicillin-clavulanate 875-125 MG tablet ?Commonly known as: AUGMENTIN ?  ?ibuprofen 600 MG tablet ?Commonly known as: ADVIL ?  ? ?  ? ?TAKE these medications   ? ?acetaminophen 325 MG tablet ?Commonly known as: Tylenol ?Take 2 tablets (650 mg total) by mouth every 6 (six) hours as needed (for pain scale < 4). ?  ? ?  ? ? ?Donette Larry, CNM ?09/26/2021, 7:26 PM  ?

## 2021-09-26 NOTE — ED Provider Notes (Signed)
?[facility] ? ? ? ?CSN: [id number] ?Arrival date & time: 09/26/21  1340 ? ? ?  ? ?History   ?Chief Complaint ?Chief Complaint  ?Patient presents with  ? Abdominal Pain  ? ? ?HPI ?Earleen Mohammed Linna Hoff Awudu is a 31 y.o. female.  ? ?Patient presents with generalized abdominal pain and cramping for 3 days with confirmed pregnancy.  Endorses that she is currently 2 months.  ? ?Past Medical History:  ?Diagnosis Date  ? Anemia   ? Pregnancy induced hypertension   ? ? ?Patient Active Problem List  ? Diagnosis Date Noted  ? Encounter for induction of labor 02/01/2020  ? Post-dates pregnancy 02/01/2020  ? Normal labor 08/15/2017  ? Vaginal delivery 08/15/2017  ? ? ?Past Surgical History:  ?Procedure Laterality Date  ? NO PAST SURGERIES    ? WISDOM TOOTH EXTRACTION    ? ? ?OB History   ? ? Gravida  ?3  ? Para  ?3  ? Term  ?3  ? Preterm  ?   ? AB  ?   ? Living  ?3  ?  ? ? SAB  ?   ? IAB  ?   ? Ectopic  ?   ? Multiple  ?0  ? Live Births  ?3  ?   ?  ?  ? ? ? ?Home Medications   ? ?Prior to Admission medications   ?Medication Sig Start Date End Date Taking? Authorizing Provider  ?acetaminophen (TYLENOL) 325 MG tablet Take 2 tablets (650 mg total) by mouth every 6 (six) hours as needed (for pain scale < 4). 02/03/20   Fair, Marin Shutter, MD  ?amoxicillin-clavulanate (AUGMENTIN) 875-125 MG tablet Take 1 tablet by mouth every 12 (twelve) hours. 12/22/20   Raspet, Derry Skill, PA-C  ?ibuprofen (ADVIL) 600 MG tablet Take 1 tablet (600 mg total) by mouth every 8 (eight) hours as needed for mild pain. 02/03/20   Chauncey Mann, MD  ? ? ?Family History ?Family History  ?Problem Relation Age of Onset  ? Diabetes Paternal Aunt   ? ? ?Social History ?Social History  ? ?Tobacco Use  ? Smoking status: Never  ? Smokeless tobacco: Never  ?Substance Use Topics  ? Alcohol use: No  ? Drug use: No  ? ? ? ?Allergies   ?Patient has no known allergies. ? ? ?Review of Systems ?Review of Systems  ?Gastrointestinal:  Positive for abdominal pain.   ? ? ?Physical Exam ?Triage Vital Signs ?ED Triage Vitals [09/26/21 1424]  ?Enc Vitals Group  ?   BP 126/82  ?   Pulse Rate 72  ?   Resp 20  ?   Temp 98 ?F (36.7 ?C)  ?   Temp Source Oral  ?   SpO2 99 %  ?   Weight   ?   Height   ?   Head Circumference   ?   Peak Flow   ?   Pain Score   ?   Pain Loc   ?   Pain Edu?   ?   Excl. in [place]?   ? ?No data found. ? ?Updated Vital Signs ?BP 126/82 (BP Location: Right Arm)   Pulse 72   Temp 98 ?F (36.7 ?C) (Oral)   Resp 20   SpO2 99%  ? ?Visual Acuity ?Right Eye Distance:   ?Left Eye Distance:   ?Bilateral Distance:   ? ?Right Eye Near:   ?Left Eye Near:    ?Bilateral Near:    ? ?  Physical Exam ? ? ?UC Treatments / Results  ?Labs ?(all labs ordered are listed, but only abnormal results are displayed) ?Labs Reviewed - No data to display ? ?EKG ? ? ?Radiology ?No results found. ? ?Procedures ?Procedures (including critical care time) ? ?Medications Ordered in UC ?Medications - No data to display ? ?Initial Impression / Assessment and Plan / UC Course  ?I have reviewed the triage vital signs and the nursing notes. ? ?Pertinent labs & imaging results that were available during my care of the patient were reviewed by me and considered in my medical decision making (see chart for details). ? ?Abdominal cramping ?First trimester pregnancy ? ?Patient sent to the maternity admissions unit for further evaluation of abdominal cramping during pregnancy ?Final Clinical Impressions(s) / UC Diagnoses  ? ?Final diagnoses:  ?Abdominal cramping  ?First trimester pregnancy  ? ?Discharge Instructions   ?None ?  ? ?ED Prescriptions   ?None ?  ? ?PDMP not reviewed this encounter. ?  ?Hans Eden, NP ?09/26/21 1433 ? ?

## 2021-09-26 NOTE — ED Triage Notes (Signed)
Pt presents today with abdominal pain , cramping and has been going on for three days. No vaginal bleeding today or fluid leakage.  ?

## 2021-09-27 LAB — GC/CHLAMYDIA PROBE AMP (~~LOC~~) NOT AT ARMC
Chlamydia: NEGATIVE
Comment: NEGATIVE
Comment: NORMAL
Neisseria Gonorrhea: NEGATIVE

## 2021-09-29 ENCOUNTER — Other Ambulatory Visit (INDEPENDENT_AMBULATORY_CARE_PROVIDER_SITE_OTHER): Payer: Medicaid Other

## 2021-09-29 ENCOUNTER — Other Ambulatory Visit: Payer: Self-pay

## 2021-09-29 VITALS — BP 124/78 | HR 89

## 2021-09-29 DIAGNOSIS — O3680X0 Pregnancy with inconclusive fetal viability, not applicable or unspecified: Secondary | ICD-10-CM | POA: Diagnosis not present

## 2021-09-29 LAB — BETA HCG QUANT (REF LAB): hCG Quant: 13680 m[IU]/mL

## 2021-09-29 NOTE — Progress Notes (Signed)
Pt here today for STAT beta s/p pregnancy of unknown location from MAU visit on 09/26/21.  Pt denies VB but is having intermittent abdominal pain that Tylenol is effective for treating.  Pt states that she is here to make sure that her levels go up.  I explained to the pt that it will take approximately 2-3 hours before her results are back in which the provider will call her with the results and f/u.  Pt verbalized understanding with no further questions.  Dr.Newton notified of stat beta and provider will call pt with results and follow up.   ? ?Kaydon Creedon,RN  ?09/29/21 ? ?

## 2021-09-29 NOTE — Progress Notes (Deleted)
Pt here today for STAT beta s/p pregnancy of unknown location.  Pt denies VB however is having  ?

## 2021-10-02 ENCOUNTER — Encounter: Payer: Self-pay | Admitting: *Deleted

## 2021-10-02 ENCOUNTER — Other Ambulatory Visit: Payer: Self-pay | Admitting: *Deleted

## 2021-10-02 DIAGNOSIS — O3680X0 Pregnancy with inconclusive fetal viability, not applicable or unspecified: Secondary | ICD-10-CM

## 2021-10-06 ENCOUNTER — Other Ambulatory Visit: Payer: Medicaid Other

## 2021-10-06 ENCOUNTER — Other Ambulatory Visit: Payer: Self-pay

## 2021-10-06 DIAGNOSIS — O3680X0 Pregnancy with inconclusive fetal viability, not applicable or unspecified: Secondary | ICD-10-CM

## 2021-10-07 LAB — BETA HCG QUANT (REF LAB): hCG Quant: 12125 m[IU]/mL

## 2021-10-10 ENCOUNTER — Other Ambulatory Visit: Payer: Self-pay

## 2021-10-10 DIAGNOSIS — O039 Complete or unspecified spontaneous abortion without complication: Secondary | ICD-10-CM

## 2021-10-12 ENCOUNTER — Telehealth: Payer: Self-pay | Admitting: Lactation Services

## 2021-10-12 NOTE — Telephone Encounter (Signed)
Called patient to advise her that she need a follow up appointment in the office to discuss treatment for missed abortion. Patient did not answer. LM for patient to call the office ASAP to schedule an office visit. Per chart review, patient has an appointment on 4/3.  ? ?

## 2021-10-12 NOTE — Telephone Encounter (Signed)
-----   Message from Caren Macadam, MD sent at 10/11/2021 12:30 PM EDT ----- ?Bhcg is dropping but not quickly. Concerned for missed AB.  ? ?Recommend patient come in for discussion about expectant management vs cytotec.  ?

## 2021-10-13 ENCOUNTER — Other Ambulatory Visit: Payer: Medicaid Other

## 2021-10-13 DIAGNOSIS — O039 Complete or unspecified spontaneous abortion without complication: Secondary | ICD-10-CM

## 2021-10-14 DIAGNOSIS — Z419 Encounter for procedure for purposes other than remedying health state, unspecified: Secondary | ICD-10-CM | POA: Diagnosis not present

## 2021-10-14 LAB — BETA HCG QUANT (REF LAB): hCG Quant: 8874 m[IU]/mL

## 2021-10-16 ENCOUNTER — Other Ambulatory Visit: Payer: Self-pay

## 2021-10-16 ENCOUNTER — Encounter: Payer: Self-pay | Admitting: Medical

## 2021-10-16 ENCOUNTER — Ambulatory Visit (INDEPENDENT_AMBULATORY_CARE_PROVIDER_SITE_OTHER): Payer: Medicaid Other | Admitting: Medical

## 2021-10-16 VITALS — BP 109/67 | HR 66 | Wt 198.0 lb

## 2021-10-16 DIAGNOSIS — O3680X0 Pregnancy with inconclusive fetal viability, not applicable or unspecified: Secondary | ICD-10-CM

## 2021-10-16 NOTE — Progress Notes (Signed)
?  History:  ?[name] is a 31 y.o. [phone] who presents to clinic today for follow-up after possible miscarriage.  The patient was first seen in [facility] on 3/14. US showed IUGS without YS or FP. Beta hCG was 10626. She returned for additional hCGs as follows:  ? ?3/17 - 13680 ?3/24 - 94854 ?3/31 - 8874 ? ?Patient states on Saturday following her last hCG drawn she noted increased bleeding with small clots and back pain. She denies fever.  ? ?The following portions of the patient's history were reviewed and updated as appropriate: allergies, current medications, family history, past medical history, social history, past surgical history and problem list. ? ?Review of Systems:  ?Review of Systems  ?Constitutional:  Negative for fever.  ?Gastrointestinal:  Negative for abdominal pain.  ?Genitourinary:   ?     + vaginal bleeding  ?Musculoskeletal:  Positive for back pain.  ? ?  ?Objective:  ?Physical Exam ?BP 109/67   Pulse 66   Wt 198 lb (89.8 kg)   LMP 07/30/2021 (Exact Date)   BMI 36.21 kg/m?  ?Physical Exam ?Constitutional:   ?   General: She is not in acute distress. ?   Appearance: She is obese.  ?Cardiovascular:  ?   Rate and Rhythm: Normal rate.  ?Pulmonary:  ?   Effort: Pulmonary effort is normal.  ?Abdominal:  ?   General: Abdomen is flat. There is no distension.  ?Skin: ?   General: Skin is warm and dry.  ?   Findings: No erythema.  ?Neurological:  ?   Mental Status: She is alert and oriented to person, place, and time.  ?Psychiatric:     ?   Mood and Affect: Mood normal.  ? ? ?Health Maintenance Due  ?Topic Date Due  ? COVID-19 Vaccine (1) Never done  ? Hepatitis C Screening  Never done  ? TETANUS/TDAP  Never done  ? PAP-Cervical Cytology Screening  Never done  ? PAP SMEAR-Modifier  Never done  ? ? ? ?Assessment & Plan:  ?Pregnancy of unknown location ?- likely miscarriage given increased bleeding and hCG continuing to drop, although 50% drop has not been documented ?- Patient in  stable condition, bleeding and ectopic precautions discussed  ?- Return 10/19/21 for STAT hCG, would like to see significant drop at this time due to bleeding and confirm SAB, if hCG has not dropped appropriately at this time, we should consider [place].  ? ?Approximately 15 minutes of total time was spent with this patient on history taking, chart review, care coordination, patient education and documentation.  ? ?Marny Lowenstein, PA-C ?10/16/2021 ?1:10 PM ? ?

## 2021-10-16 NOTE — Progress Notes (Signed)
Patient reports lower back pain in addition to "heavy" vaginal bleeding/clots.  ?

## 2021-10-19 ENCOUNTER — Other Ambulatory Visit: Payer: Medicaid Other

## 2021-10-19 DIAGNOSIS — O3680X0 Pregnancy with inconclusive fetal viability, not applicable or unspecified: Secondary | ICD-10-CM

## 2021-10-20 LAB — BETA HCG QUANT (REF LAB): hCG Quant: 6476 m[IU]/mL

## 2021-10-24 ENCOUNTER — Telehealth: Payer: Self-pay

## 2021-10-24 DIAGNOSIS — O3680X0 Pregnancy with inconclusive fetal viability, not applicable or unspecified: Secondary | ICD-10-CM

## 2021-10-24 NOTE — Telephone Encounter (Addendum)
-----   Message from Marny Lowenstein, PA-C sent at 10/24/2021  2:59 PM EDT ----- ?Yes, I sent a message yesterday to get her scheduled ASAP and don't see that it has been done yet. She still has PUL and hCGs aren't dropping as expected. Thank you ? ? ?Called pt; VM left stating I am calling with [place] appt. Appt scheduled for 10/30/21. Pt is MyChart active last 10/23/21. Message sent with [place] appt via MyChart. ?

## 2021-10-29 ENCOUNTER — Other Ambulatory Visit: Payer: Self-pay

## 2021-10-29 ENCOUNTER — Emergency Department (HOSPITAL_COMMUNITY)
Admission: EM | Admit: 2021-10-29 | Discharge: 2021-10-30 | Disposition: A | Discharge status: Home / Self Care | Payer: Medicaid Other | Attending: Emergency Medicine | Admitting: Emergency Medicine

## 2021-10-29 ENCOUNTER — Encounter (HOSPITAL_COMMUNITY): Payer: Self-pay | Admitting: Emergency Medicine

## 2021-10-29 DIAGNOSIS — H748X1 Other specified disorders of right middle ear and mastoid: Secondary | ICD-10-CM | POA: Insufficient documentation

## 2021-10-29 DIAGNOSIS — H65191 Other acute nonsuppurative otitis media, right ear: Secondary | ICD-10-CM

## 2021-10-29 DIAGNOSIS — H9201 Otalgia, right ear: Secondary | ICD-10-CM | POA: Diagnosis not present

## 2021-10-29 DIAGNOSIS — K0889 Other specified disorders of teeth and supporting structures: Secondary | ICD-10-CM | POA: Diagnosis not present

## 2021-10-29 NOTE — ED Triage Notes (Signed)
Pt reported to ED with c/o toothache x2 days. States she took tylenol yesterday and it stopped but started again today. States she had ear pain left week. Pt also states her eyes are red and bothersome.  ?

## 2021-10-30 ENCOUNTER — Ambulatory Visit: Payer: Medicaid Other

## 2021-10-30 ENCOUNTER — Ambulatory Visit
Admission: RE | Admit: 2021-10-30 | Discharge: 2021-10-30 | Disposition: A | Discharge status: Home / Self Care | Payer: Medicaid Other | Source: Ambulatory Visit | Attending: Medical | Admitting: Medical

## 2021-10-30 ENCOUNTER — Encounter: Payer: Self-pay | Admitting: Obstetrics and Gynecology

## 2021-10-30 ENCOUNTER — Ambulatory Visit (INDEPENDENT_AMBULATORY_CARE_PROVIDER_SITE_OTHER): Payer: Medicaid Other | Admitting: Obstetrics and Gynecology

## 2021-10-30 DIAGNOSIS — O021 Missed abortion: Secondary | ICD-10-CM | POA: Diagnosis not present

## 2021-10-30 DIAGNOSIS — O3680X0 Pregnancy with inconclusive fetal viability, not applicable or unspecified: Secondary | ICD-10-CM | POA: Insufficient documentation

## 2021-10-30 DIAGNOSIS — O039 Complete or unspecified spontaneous abortion without complication: Secondary | ICD-10-CM | POA: Diagnosis not present

## 2021-10-30 MED ORDER — OXYCODONE-ACETAMINOPHEN 5-325 MG PO TABS: 1.0000 | ORAL_TABLET | Freq: Four times a day (QID) | ORAL | 0 refills | Status: DC | PRN

## 2021-10-30 MED ORDER — AMOXICILLIN 500 MG PO CAPS: 500.0000 mg | ORAL_CAPSULE | Freq: Three times a day (TID) | ORAL | 0 refills | Status: DC

## 2021-10-30 MED ORDER — IBUPROFEN 800 MG PO TABS: 800.0000 mg | ORAL_TABLET | Freq: Three times a day (TID) | ORAL | 3 refills | Status: DC | PRN

## 2021-10-30 MED ORDER — MISOPROSTOL 200 MCG PO TABS: ORAL_TABLET | ORAL | 1 refills | Status: DC

## 2021-10-30 NOTE — Progress Notes (Signed)
Ms Awudu presents for review of U/S. U/S reveals missed AB. Reviewed with pt. ?Bleeding off/on for the last month ?No fever or chills ?Blood type O + ? ?PE AF VSS ?Lungs clear Heart RRR ?Abd soft + BS ?GU deferred ? ?A/P MAB ? ?Tx options reviewed with pt. Desires Cytotec. U/R/B reviewed. Rx for Cytotec/motrin and Percocet provided. F/U in 2 weeks  ?

## 2021-10-30 NOTE — Discharge Instructions (Signed)
Use Tylenol 1000 mg with ibuprofen 600 mg every 6 hours as needed for pain. ?*Take antibiotics as prescribed.  Follow-up with a dentist.  Return for breathing difficulty, throat closing sensation or worsening symptoms. ? ?

## 2021-10-30 NOTE — ED Provider Notes (Signed)
?[facility] EMERGENCY DEPARTMENT ?Provider Note ? ? ?CSN: [id number] ?Arrival date & time: 10/29/21  2135 ? ?  ? ?History ? ?Chief Complaint  ?Patient presents with  ? Dental Pain  ? ? ?Julie [name] is a 31 y.o. female. ? ?Patient presents with worsening right upper tooth ache for 2 days.  Patient had wisdom teeth removed in the past otherwise has no active dental problems.  Patient has mild ear discomfort as well with it.  No fevers or chills.  No vomiting or shortness of breath.  No trauma. ? ? ?  ? ?Home Medications ?Prior to Admission medications   ?Medication Sig Start Date End Date Taking? Authorizing Provider  ?acetaminophen (TYLENOL) 325 MG tablet Take 2 tablets (650 mg total) by mouth every 6 (six) hours as needed (for pain scale < 4). ?Patient taking differently: Take 650 mg by mouth every 6 (six) hours as needed for moderate pain. 02/03/20  Yes Fair, Hoyle Sauer, MD  ?amoxicillin (AMOXIL) 500 MG capsule Take 1 capsule (500 mg total) by mouth 3 (three) times daily. 10/30/21  Yes Blane Ohara, MD  ?benzocaine (ORAJEL) 10 % mucosal gel Use as directed 1 application. in the mouth or throat every 4 (four) hours as needed for mouth pain.   Yes [provider]  ?ibuprofen (ADVIL) 200 MG tablet Take 200 mg by mouth every 6 (six) hours as needed for headache or mild pain.   Yes [provider]  ?   ? ?Allergies    ?Patient has no known allergies.   ? ?Review of Systems   ?Review of Systems  ?Constitutional:  Negative for chills and fever.  ?HENT:  Negative for congestion.   ?Eyes:  Negative for visual disturbance.  ?Respiratory:  Negative for shortness of breath.   ?Cardiovascular:  Negative for chest pain.  ?Gastrointestinal:  Negative for abdominal pain and vomiting.  ?Genitourinary:  Negative for dysuria and flank pain.  ?Musculoskeletal:  Negative for back pain, neck pain and neck stiffness.  ?Skin:  Negative for rash.  ?Neurological:  Negative for  light-headedness and headaches.  ? ?Physical Exam ?Updated Vital Signs ?BP 108/67 (BP Location: Right Arm)   Pulse 76   Temp 98 ?F (36.7 ?C) (Oral)   Resp 17   LMP 07/30/2021 (Exact Date)   SpO2 100%  ?Physical Exam ?Vitals and nursing note reviewed.  ?Constitutional:   ?   General: She is not in acute distress. ?   Appearance: She is well-developed.  ?HENT:  ?   Head: Normocephalic and atraumatic.  ?   Comments: Patient has no trismus, no gingival swelling, excellent dentition, no fluctuance.  Patient has mild tenderness right upper posterior gingiva.  Patient has right ear effusion, neck supple minimal cervical adenopathy. ?   Mouth/Throat:  ?   Mouth: Mucous membranes are moist.  ?Eyes:  ?   General:     ?   Right eye: No discharge.     ?   Left eye: No discharge.  ?   Conjunctiva/sclera: Conjunctivae normal.  ?Neck:  ?   Trachea: No tracheal deviation.  ?Cardiovascular:  ?   Rate and Rhythm: Normal rate.  ?Pulmonary:  ?   Effort: Pulmonary effort is normal.  ?Abdominal:  ?   General: There is no distension.  ?   Palpations: Abdomen is soft.  ?   Tenderness: There is no abdominal tenderness. There is no guarding.  ?Musculoskeletal:  ?   Cervical back: Normal range  of motion and neck supple. No rigidity.  ?Skin: ?   General: Skin is warm.  ?   Capillary Refill: Capillary refill takes less than 2 seconds.  ?   Findings: No rash.  ?Neurological:  ?   General: No focal deficit present.  ?   Mental Status: She is alert.  ?   Cranial Nerves: No cranial nerve deficit.  ?Psychiatric:     ?   Mood and Affect: Mood normal.  ? ? ?ED Results / Procedures / Treatments   ?Labs ?(all labs ordered are listed, but only abnormal results are displayed) ?Labs Reviewed - No data to display ? ?EKG ?None ? ?Radiology ?No results found. ? ?Procedures ?Procedures  ? ? ?Medications Ordered in ED ?Medications - No data to display ? ?ED Course/ Medical Decision Making/ A&P ?  ?                        ?Medical Decision  Making ?Risk ?Prescription drug management. ? ? ?Patient presents with right upper dental pain without signs of abscess or significant infection and right ear pain.  Discussed differential including mild dental infection versus acute otitis media.  No signs of otitis externa, no signs of deep space infection at this time.  Discussed amoxicillin, Tylenol and ibuprofen together and follow-up with a dentist or primary care doctor if no improvement by the weekend. ? ? ? ? ? ? ? ?Final Clinical Impression(s) / ED Diagnoses ?Final diagnoses:  ?Pain, dental  ?Acute effusion of right ear  ? ? ?Rx / DC Orders ?ED Discharge Orders   ? ?      Ordered  ?  amoxicillin (AMOXIL) 500 MG capsule  3 times daily       ? 10/30/21 1027  ? ?  ?  ? ?  ? ? ?  ?Blane Ohara, MD ?10/30/21 1031 ? ?

## 2021-11-13 ENCOUNTER — Ambulatory Visit (INDEPENDENT_AMBULATORY_CARE_PROVIDER_SITE_OTHER): Payer: Medicaid Other | Admitting: Obstetrics and Gynecology

## 2021-11-13 ENCOUNTER — Encounter: Payer: Self-pay | Admitting: Obstetrics and Gynecology

## 2021-11-13 VITALS — BP 111/78 | HR 78 | Ht 62.0 in | Wt 197.4 lb

## 2021-11-13 DIAGNOSIS — Z419 Encounter for procedure for purposes other than remedying health state, unspecified: Secondary | ICD-10-CM | POA: Diagnosis not present

## 2021-11-13 DIAGNOSIS — O021 Missed abortion: Secondary | ICD-10-CM

## 2021-11-13 DIAGNOSIS — Z309 Encounter for contraceptive management, unspecified: Secondary | ICD-10-CM | POA: Insufficient documentation

## 2021-11-13 DIAGNOSIS — Z30011 Encounter for initial prescription of contraceptive pills: Secondary | ICD-10-CM

## 2021-11-13 MED ORDER — DESOGESTREL-ETHINYL ESTRADIOL 0.15-30 MG-MCG PO TABS
1.0000 | ORAL_TABLET | Freq: Every day | ORAL | 11 refills | Status: DC
Start: 2021-11-13 — End: 2022-02-22

## 2021-11-13 NOTE — Progress Notes (Signed)
Pt presents for F/U SAB. See prior office notes. Completed cytotec with 1 week bleeding afterwards. Bleeding has stopped. ?Denies any bowel or bladder dysfunction ?Desires OCP's ? ?PE AF VSS ?Lungs clear  Heart RRR ?Abd soft + BS ? ?A/P  SAB ?        Contraception management ? ?Will check BHCG U/S. Start Apri. U/R/B/back up method reviewed. ?F/U per test results ?

## 2021-11-13 NOTE — Patient Instructions (Signed)

## 2021-11-14 LAB — BETA HCG QUANT (REF LAB): hCG Quant: 3 m[IU]/mL

## 2021-11-30 ENCOUNTER — Ambulatory Visit: Payer: Medicaid Other

## 2021-12-14 DIAGNOSIS — Z419 Encounter for procedure for purposes other than remedying health state, unspecified: Secondary | ICD-10-CM | POA: Diagnosis not present

## 2022-01-13 DIAGNOSIS — Z419 Encounter for procedure for purposes other than remedying health state, unspecified: Secondary | ICD-10-CM | POA: Diagnosis not present

## 2022-01-15 ENCOUNTER — Ambulatory Visit (INDEPENDENT_AMBULATORY_CARE_PROVIDER_SITE_OTHER): Payer: Medicaid Other

## 2022-01-15 ENCOUNTER — Telehealth: Payer: Self-pay | Admitting: Family Medicine

## 2022-01-15 DIAGNOSIS — Z3201 Encounter for pregnancy test, result positive: Secondary | ICD-10-CM

## 2022-01-15 DIAGNOSIS — O219 Vomiting of pregnancy, unspecified: Secondary | ICD-10-CM

## 2022-01-15 DIAGNOSIS — O0990 Supervision of high risk pregnancy, unspecified, unspecified trimester: Secondary | ICD-10-CM

## 2022-01-15 NOTE — Telephone Encounter (Signed)
Patient called and has very bad nausea. She would like to see if she can be prescribed anything in the meantime while she establishes prenatal care here.

## 2022-01-15 NOTE — Progress Notes (Signed)
Patient dropped off urine today for a pregnancy test. Urine pregnancy test positive. I attempted to call patient to review these results with her. Patient's number went straight to voicemail. Voicemail left asking patient to call us back to review results. Mychart message also sent to patient requesting patient call us for results.   Alesia Richards, RN 01/15/22

## 2022-01-17 LAB — POCT PREGNANCY, URINE: Preg Test, Ur: POSITIVE — AB

## 2022-01-18 ENCOUNTER — Encounter: Payer: Self-pay | Admitting: *Deleted

## 2022-01-18 MED ORDER — PROMETHAZINE HCL 25 MG PO TABS: 25.0000 mg | ORAL_TABLET | Freq: Four times a day (QID) | ORAL | 1 refills | Status: DC | PRN

## 2022-01-18 NOTE — Telephone Encounter (Signed)
Call returned to pt and discussed her concern.  She stated that she has viewed the Mychart message regarding positive UPT result from 7/3. She is an established pt and would like to schedule prenatal care in this office. She has history of recent miscarriage in April of this year. Pt reports normal LMP 11/27/21. This yields EDD 09/03/22 and now [redacted]w[redacted]d EGA. Pt is also having nausea and desires Rx. She was advised that I will send in Rx for Promethazine.  She may take 1/2 tablet if the whole tablet causes her to be too sleepy. Prenatal care appointments will be scheduled and she will be notified via Mychart.  Pt voiced understanding of all information given.

## 2022-02-01 ENCOUNTER — Telehealth (INDEPENDENT_AMBULATORY_CARE_PROVIDER_SITE_OTHER): Payer: Medicaid Other

## 2022-02-01 DIAGNOSIS — O09299 Supervision of pregnancy with other poor reproductive or obstetric history, unspecified trimester: Secondary | ICD-10-CM

## 2022-02-01 DIAGNOSIS — Z3A00 Weeks of gestation of pregnancy not specified: Secondary | ICD-10-CM

## 2022-02-01 DIAGNOSIS — O0990 Supervision of high risk pregnancy, unspecified, unspecified trimester: Secondary | ICD-10-CM | POA: Insufficient documentation

## 2022-02-01 DIAGNOSIS — O139 Gestational [pregnancy-induced] hypertension without significant proteinuria, unspecified trimester: Secondary | ICD-10-CM

## 2022-02-01 DIAGNOSIS — Z8759 Personal history of other complications of pregnancy, childbirth and the puerperium: Secondary | ICD-10-CM | POA: Insufficient documentation

## 2022-02-01 HISTORY — DX: Gestational (pregnancy-induced) hypertension without significant proteinuria, unspecified trimester: O13.9

## 2022-02-01 MED ORDER — BLOOD PRESSURE KIT DEVI: 1.0000 | 0 refills | Status: DC | PRN

## 2022-02-01 MED ORDER — GOJJI WEIGHT SCALE MISC: 1.0000 | 0 refills | Status: DC | PRN

## 2022-02-01 NOTE — Progress Notes (Signed)
New OB Intake  I connected with  Julie [name] on 02/01/22 at  1:15 PM EDT by MyChart Video Visit and verified that I am speaking with the correct person using two identifiers. Nurse is located at [facility] and pt is located at home.  I discussed the limitations, risks, security and privacy concerns of performing an evaluation and management service by telephone and the availability of in person appointments. I also discussed with the patient that there may be a patient responsible charge related to this service. The patient expressed understanding and agreed to proceed.  I explained I am completing New OB Intake today. We discussed her EDD of 09/03/2022 that is based on LMP of 11/27/2021. Pt is G5/P3. I reviewed her allergies, medications, Medical/Surgical/OB history, and appropriate screenings. I informed her of [facility] services. [facility] information placed in AVS. Based on history, this is a/an  pregnancy complicated by    .   Patient Active Problem List   Diagnosis Date Noted   Supervision of high risk pregnancy, antepartum 02/01/2022   History of pre-eclampsia 02/01/2022   Pregnancy induced hypertension 02/01/2022   Contraception management 11/13/2021   Missed ab 10/30/2021     Concerns addressed today: -Patient stated she was induced to hypertension during pregnancy with her first pregnancy -pap smear, genetic screening, and initial OB labs will be obtain at her first OB appointment.   Delivery Plans Plans to deliver at [facility]. Patient given information for [facility] Healthy Baby website for more information about [facility]. Patient is not interested in water birth. Offered upcoming OB visit with CNM to discuss further.  MyChart/Babyscripts MyChart access verified. I explained pt will have some visits in office and some virtually. Babyscripts instructions given and order placed. Patient verifies receipt of registration text/e-mail. Account successfully created and  app downloaded.  Blood Pressure Cuff/Weight Scale Blood pressure cuff ordered for patient to pick-up from [employer]. Explained after first prenatal appt pt will check weekly and document in Babyscripts. Patient does not  have weight scale. Weight scale ordered for patient to pick up from [employer].   Anatomy US Explained first scheduled [place] will be around 19 weeks. Anatomy US scheduled for 04/09/2024 at 10:30AM. Pt notified to arrive at 10:15AM.  Labs Discussed Natera genetic screening with patient. Would like both Panorama and Horizon drawn at new OB visit. Routine prenatal labs needed.  Covid Vaccine Patient has not covid vaccine.   Is patient a CenteringPregnancy candidate?  Declined Declined due to Support Person Concern    Is patient a Mom+Baby Combined Care candidate?  Not a candidate    Social Determinants of Health Food Insecurity: Patient denies food insecurity. WIC Referral: Patient is interested in referral to [facility].  Transportation: Patient denies transportation needs. Childcare: Discussed no children allowed at ultrasound appointments. Offered childcare services; patient declines childcare services at this time.  First visit review I reviewed new OB appt with pt. I explained she will have a provider visit that includes pap smear, initial OB labs, and genetic screening. Explained pt will be seen by Corlis Hove NP at first visit; encounter routed to appropriate provider. Explained that patient will be seen by pregnancy navigator following visit with provider.   Vidal Schwalbe, [place] 02/01/2022  1:39 PM

## 2022-02-13 DIAGNOSIS — Z419 Encounter for procedure for purposes other than remedying health state, unspecified: Secondary | ICD-10-CM | POA: Diagnosis not present

## 2022-02-19 ENCOUNTER — Encounter: Payer: Medicaid Other | Admitting: Student

## 2022-02-22 ENCOUNTER — Other Ambulatory Visit: Payer: Self-pay

## 2022-02-22 ENCOUNTER — Encounter: Payer: Self-pay | Admitting: Obstetrics and Gynecology

## 2022-02-22 ENCOUNTER — Ambulatory Visit (INDEPENDENT_AMBULATORY_CARE_PROVIDER_SITE_OTHER): Payer: Medicaid Other | Admitting: Obstetrics and Gynecology

## 2022-02-22 VITALS — BP 107/75 | HR 89 | Wt 190.7 lb

## 2022-02-22 DIAGNOSIS — Z8759 Personal history of other complications of pregnancy, childbirth and the puerperium: Secondary | ICD-10-CM | POA: Diagnosis not present

## 2022-02-22 DIAGNOSIS — O0990 Supervision of high risk pregnancy, unspecified, unspecified trimester: Secondary | ICD-10-CM | POA: Diagnosis not present

## 2022-02-22 MED ORDER — PRENATAL PLUS 27-1 MG PO TABS: 1.0000 | ORAL_TABLET | Freq: Every day | ORAL | 11 refills | Status: DC

## 2022-02-22 MED ORDER — ASPIRIN 81 MG PO TBEC: 81.0000 mg | DELAYED_RELEASE_TABLET | Freq: Every day | ORAL | 2 refills | Status: DC

## 2022-02-22 MED ORDER — ASPIRIN 81 MG PO TBEC: 81.0000 mg | DELAYED_RELEASE_TABLET | Freq: Every day | ORAL | 0 refills | Status: DC

## 2022-02-22 NOTE — Patient Instructions (Signed)
First Trimester of Pregnancy  The first trimester of pregnancy starts on the first day of your last menstrual period until the end of week 12. This is months 1 through 3 of pregnancy. A week after a sperm fertilizes an egg, the egg will implant into the wall of the uterus and begin to develop into a baby. By the end of 12 weeks, all the baby's organs will be formed and the baby will be 2-3 inches in size. Body changes during your first trimester Your body goes through many changes during pregnancy. The changes vary and generally return to normal after your baby is born. Physical changes You may gain or lose weight. Your breasts may begin to grow larger and become tender. The tissue that surrounds your nipples (areola) may become darker. Dark spots or blotches (chloasma or mask of pregnancy) may develop on your face. You may have changes in your hair. These can include thickening or thinning of your hair or changes in texture. Health changes You may feel nauseous, and you may vomit. You may have heartburn. You may develop headaches. You may develop constipation. Your gums may bleed and may be sensitive to brushing and flossing. Other changes You may tire easily. You may urinate more often. Your menstrual periods will stop. You may have a loss of appetite. You may develop cravings for certain kinds of food. You may have changes in your emotions from day to day. You may have more vivid and strange dreams. Follow these instructions at home: Medicines Follow your health care provider's instructions regarding medicine use. Specific medicines may be either safe or unsafe to take during pregnancy. Do not take any medicines unless told to by your health care provider. Take a prenatal vitamin that contains at least 600 micrograms (mcg) of folic acid. Eating and drinking Eat a healthy diet that includes fresh fruits and vegetables, whole grains, good sources of protein such as meat, eggs, or tofu,  and low-fat dairy products. Avoid raw meat and unpasteurized juice, milk, and cheese. These carry germs that can harm you and your baby. If you feel nauseous or you vomit: Eat 4 or 5 small meals a day instead of 3 large meals. Try eating a few soda crackers. Drink liquids between meals instead of during meals. You may need to take these actions to prevent or treat constipation: Drink enough fluid to keep your urine pale yellow. Eat foods that are high in fiber, such as beans, whole grains, and fresh fruits and vegetables. Limit foods that are high in fat and processed sugars, such as fried or sweet foods. Activity Exercise only as directed by your health care provider. Most people can continue their usual exercise routine during pregnancy. Try to exercise for 30 minutes at least 5 days a week. Stop exercising if you develop pain or cramping in the lower abdomen or lower back. Avoid exercising if it is very hot or humid or if you are at high altitude. Avoid heavy lifting. If you choose to, you may have sex unless your health care provider tells you not to. Relieving pain and discomfort Wear a good support bra to relieve breast tenderness. Rest with your legs elevated if you have leg cramps or low back pain. If you develop bulging veins (varicose veins) in your legs: Wear support hose as told by your health care provider. Elevate your feet for 15 minutes, 3-4 times a day. Limit salt in your diet. Safety Wear your seat belt at all times when   driving or riding in a car. Talk with your health care provider if someone is verbally or physically abusive to you. Talk with your health care provider if you are feeling sad or have thoughts of hurting yourself. Lifestyle Do not use hot tubs, steam rooms, or saunas. Do not douche. Do not use tampons or scented sanitary pads. Do not use herbal remedies, alcohol, illegal drugs, or medicines that are not approved by your health care provider. Chemicals  in these products can harm your baby. Do not use any products that contain nicotine or tobacco, such as cigarettes, e-cigarettes, and chewing tobacco. If you need help quitting, ask your health care provider. Avoid cat litter boxes and soil used by cats. These carry germs that can cause birth defects in the baby and possibly loss of the unborn baby (fetus) by miscarriage or stillbirth. General instructions During routine prenatal visits in the first trimester, your health care provider will do a physical exam, perform necessary tests, and ask you how things are going. Keep all follow-up visits. This is important. Ask for help if you have counseling or nutritional needs during pregnancy. Your health care provider can offer advice or refer you to specialists for help with various needs. Schedule a dentist appointment. At home, brush your teeth with a soft toothbrush. Floss gently. Write down your questions. Take them to your prenatal visits. Where to find more information American Pregnancy Association: americanpregnancy.org American College of Obstetricians and Gynecologists: acog.org/en/Womens%20Health/Pregnancy Office on Women's Health: womenshealth.gov/pregnancy Contact a health care provider if you have: Dizziness. A fever. Mild pelvic cramps, pelvic pressure, or nagging pain in the abdominal area. Nausea, vomiting, or diarrhea that lasts for 24 hours or longer. A bad-smelling vaginal discharge. Pain when you urinate. Known exposure to a contagious illness, such as chickenpox, measles, Zika virus, HIV, or hepatitis. Get help right away if you have: Spotting or bleeding from your vagina. Severe abdominal cramping or pain. Shortness of breath or chest pain. Any kind of trauma, such as from a fall or a car crash. New or increased pain, swelling, or redness in an arm or leg. Summary The first trimester of pregnancy starts on the first day of your last menstrual period until the end of week  12 (months 1 through 3). Eating 4 or 5 small meals a day rather than 3 large meals may help to relieve nausea and vomiting. Do not use any products that contain nicotine or tobacco, such as cigarettes, e-cigarettes, and chewing tobacco. If you need help quitting, ask your health care provider. Keep all follow-up visits. This is important. This information is not intended to replace advice given to you by your health care provider. Make sure you discuss any questions you have with your health care provider. Document Revised: 12/09/2019 Document Reviewed: 10/15/2019 Elsevier Patient Education  2023 Elsevier Inc.  

## 2022-02-22 NOTE — Addendum Note (Signed)
Addended by: Henrietta Dine on: 02/22/2022 04:52 PM   Modules accepted: Orders

## 2022-02-22 NOTE — Progress Notes (Signed)
Subjective:  Julie [name] is a 31 y.o. [phone] at [redacted]w[redacted]d being seen today for her first OB visit. EDD by LMP. H/O TSVD x 3. H/o PEC/PIH with first pregnancy.    She is currently monitored for the following issues for this low-risk pregnancy and has Supervision of high risk pregnancy, antepartum and History of pre-eclampsia on their problem list.  Patient reports had some vaginal bleeding last week after intercourse. None since.   . Vag. Bleeding: Bloody Show.  Movement: Absent. Denies leaking of fluid.   The following portions of the patient's history were reviewed and updated as appropriate: allergies, current medications, past family history, past medical history, past social history, past surgical history and problem list. Problem list updated.  Objective:   Vitals:   02/22/22 0837  BP: 107/75  Pulse: 89  Weight: 190 lb 11.2 oz (86.5 kg)    Fetal Status: Fetal Heart Rate (bpm): 153   Movement: Absent     General:  Alert, oriented and cooperative. Patient is in no acute distress.  Skin: Skin is warm and dry. No rash noted.   Cardiovascular: Normal heart rate noted  Respiratory: Normal respiratory effort, no problems with respiration noted  Abdomen: Soft, gravid, appropriate for gestational age. Pain/Pressure: Present     Pelvic:  Cervical exam deferred        Extremities: Normal range of motion.  Edema: None  Mental Status: Normal mood and affect. Normal behavior. Normal judgment and thought content.   Urinalysis:      Assessment and Plan:  Pregnancy: [id number] at [redacted]w[redacted]d  1. Supervision of high risk pregnancy, antepartum Prenatal care and labs reviewed with pt. Genetic testing reviewed - prenatal vitamin w/FE, FA (PRENATAL 1 + 1) 27-1 MG TABS tablet; Take 1 tablet by mouth daily at 12 noon.  Dispense: 30 tablet; Refill: 11 - Culture, OB Urine - GC/Chlamydia probe amp ([place])not at [facility] - CBC/D/Plt+RPR+Rh+ABO+RubIgG... - Hemoglobin A1c - Protein / creatinine  ratio, urine - Comprehensive metabolic panel - Panorama Prenatal Test Full Panel - HORIZON CUSTOM  2. History of pre-eclampsia  - Protein / creatinine ratio, urine - Comprehensive metabolic panel - aspirin EC 81 MG tablet; Take 1 tablet (81 mg total) by mouth daily. Take after 12 weeks for prevention of preeclampsia later in pregnancy  Dispense: 300 tablet; Refill: 2  Preterm labor symptoms and general obstetric precautions including but not limited to vaginal bleeding, contractions, leaking of fluid and fetal movement were reviewed in detail with the patient. Please refer to After Visit Summary for other counseling recommendations.  Return in about 4 weeks (around 03/22/2022) for OB visit, face to face, any provider, prefer's females.   Hermina Staggers, MD

## 2022-02-22 NOTE — Progress Notes (Signed)
Pt reports blood clots last week but stopped , can't remember when it stopped.

## 2022-02-23 LAB — COMPREHENSIVE METABOLIC PANEL
ALT: 11 IU/L (ref 0–32)
AST: 12 IU/L (ref 0–40)
Albumin/Globulin Ratio: 1.3 (ref 1.2–2.2)
Albumin: 4.3 g/dL (ref 4.0–5.0)
Alkaline Phosphatase: 42 IU/L — ABNORMAL LOW (ref 44–121)
BUN/Creatinine Ratio: 15 (ref 9–23)
BUN: 8 mg/dL (ref 6–20)
Bilirubin Total: 0.4 mg/dL (ref 0.0–1.2)
CO2: 20 mmol/L (ref 20–29)
Calcium: 9.7 mg/dL (ref 8.7–10.2)
Chloride: 100 mmol/L (ref 96–106)
Creatinine, Ser: 0.53 mg/dL — ABNORMAL LOW (ref 0.57–1.00)
Globulin, Total: 3.3 g/dL (ref 1.5–4.5)
Glucose: 83 mg/dL (ref 70–99)
Potassium: 3.9 mmol/L (ref 3.5–5.2)
Sodium: 136 mmol/L (ref 134–144)
Total Protein: 7.6 g/dL (ref 6.0–8.5)
eGFR: 128 mL/min/{1.73_m2} (ref >59)

## 2022-02-23 LAB — CBC/D/PLT+RPR+RH+ABO+RUBIGG...
Antibody Screen: NEGATIVE
Basophils Absolute: 0 10*3/uL (ref 0.0–0.2)
Basos: 0 %
EOS (ABSOLUTE): 0.1 10*3/uL (ref 0.0–0.4)
Eos: 1 %
HCV Ab: NONREACTIVE
HIV Screen 4th Generation wRfx: NONREACTIVE
Hematocrit: 35.2 % (ref 34.0–46.6)
Hemoglobin: 11.2 g/dL (ref 11.1–15.9)
Hepatitis B Surface Ag: NEGATIVE
Immature Grans (Abs): 0 10*3/uL (ref 0.0–0.1)
Immature Granulocytes: 0 %
Lymphocytes Absolute: 1.8 10*3/uL (ref 0.7–3.1)
Lymphs: 29 %
MCH: 23.3 pg — ABNORMAL LOW (ref 26.6–33.0)
MCHC: 31.8 g/dL (ref 31.5–35.7)
MCV: 73 fL — ABNORMAL LOW (ref 79–97)
Monocytes Absolute: 0.5 10*3/uL (ref 0.1–0.9)
Monocytes: 9 %
Neutrophils Absolute: 3.9 10*3/uL (ref 1.4–7.0)
Neutrophils: 61 %
Platelets: 315 10*3/uL (ref 150–450)
RBC: 4.81 x10E6/uL (ref 3.77–5.28)
RDW: 15.3 % (ref 11.7–15.4)
RPR Ser Ql: NONREACTIVE
Rh Factor: POSITIVE
Rubella Antibodies, IGG: 17.7 index (ref >0.99)
WBC: 6.3 10*3/uL (ref 3.4–10.8)

## 2022-02-23 LAB — PROTEIN / CREATININE RATIO, URINE
Creatinine, Urine: 395.7 mg/dL
Protein, Ur: 36.2 mg/dL
Protein/Creat Ratio: 91 mg/g creat (ref 0–200)

## 2022-02-23 LAB — HEMOGLOBIN A1C
Est. average glucose Bld gHb Est-mCnc: 114 mg/dL
Hgb A1c MFr Bld: 5.6 % (ref 4.8–5.6)

## 2022-02-23 LAB — HCV INTERPRETATION

## 2022-02-24 LAB — CULTURE, OB URINE

## 2022-02-24 LAB — URINE CULTURE, OB REFLEX

## 2022-02-26 LAB — PANORAMA PRENATAL TEST FULL PANEL:PANORAMA TEST PLUS 5 ADDITIONAL MICRODELETIONS: FETAL FRACTION: 15.9

## 2022-03-12 DIAGNOSIS — O0990 Supervision of high risk pregnancy, unspecified, unspecified trimester: Secondary | ICD-10-CM | POA: Diagnosis not present

## 2022-03-16 DIAGNOSIS — Z419 Encounter for procedure for purposes other than remedying health state, unspecified: Secondary | ICD-10-CM | POA: Diagnosis not present

## 2022-03-23 ENCOUNTER — Ambulatory Visit (INDEPENDENT_AMBULATORY_CARE_PROVIDER_SITE_OTHER): Payer: Medicaid Other | Admitting: Certified Nurse Midwife

## 2022-03-23 ENCOUNTER — Other Ambulatory Visit: Payer: Self-pay

## 2022-03-23 VITALS — BP 118/74 | HR 90 | Wt 198.0 lb

## 2022-03-23 DIAGNOSIS — O0993 Supervision of high risk pregnancy, unspecified, third trimester: Secondary | ICD-10-CM | POA: Diagnosis not present

## 2022-03-23 DIAGNOSIS — Z3A16 16 weeks gestation of pregnancy: Secondary | ICD-10-CM

## 2022-03-23 DIAGNOSIS — Z2803 Immunization not carried out because of immune compromised state of patient: Secondary | ICD-10-CM

## 2022-03-23 DIAGNOSIS — O0990 Supervision of high risk pregnancy, unspecified, unspecified trimester: Secondary | ICD-10-CM | POA: Diagnosis not present

## 2022-03-23 DIAGNOSIS — Z23 Encounter for immunization: Secondary | ICD-10-CM

## 2022-03-23 DIAGNOSIS — Z8759 Personal history of other complications of pregnancy, childbirth and the puerperium: Secondary | ICD-10-CM

## 2022-03-24 NOTE — Progress Notes (Signed)
   PRENATAL VISIT NOTE  Subjective:  Julie [name] is a 31 y.o. [phone] at [redacted]w[redacted]d being seen today for ongoing prenatal care.  She is currently monitored for the following issues for this high-risk pregnancy and has Supervision of high risk pregnancy, antepartum and History of pre-eclampsia on their problem list.  Patient reports no complaints.   . Vag. Bleeding: None.  Movement: Present. Denies leaking of fluid.   The following portions of the patient's history were reviewed and updated as appropriate: allergies, current medications, past family history, past medical history, past social history, past surgical history and problem list.   Objective:   Vitals:   03/23/22 1059  BP: 118/74  Pulse: 90  Weight: 198 lb (89.8 kg)    Fetal Status: Fetal Heart Rate (bpm): 140   Movement: Present     General:  Alert, oriented and cooperative. Patient is in no acute distress.  Skin: Skin is warm and dry. No rash noted.   Cardiovascular: Normal heart rate noted  Respiratory: Normal respiratory effort, no problems with respiration noted  Abdomen: Soft, gravid, appropriate for gestational age.  Pain/Pressure: Absent     Pelvic: Cervical exam deferred        Extremities: Normal range of motion.  Edema: None  Mental Status: Normal mood and affect. Normal behavior. Normal judgment and thought content.   Assessment and Plan:  Pregnancy: [id number] at [redacted]w[redacted]d 1. Supervision of high risk pregnancy, antepartum - Patient starting to feel fetal movement  - Flu Vaccine QUAD 47mo+IM (Fluarix, Fluzone & Alfiuria Quad PF) - QuantiFERON-TB Gold Plus  2. History of pre-eclampsia - BPs stable in this pregnancy.  - Pre E signs and symptoms reviewed.   3. [redacted] weeks gestation of pregnancy - Anatomy scan scheduled for next visit.   4. Vaccination not carried out because of immune compromised state - Patient is in a Phlebotomy class and requires MMR, Varicella and Tdap vaccines. Discussed that due to  those being live vaccines they are recommended in pregnancy and if she needed a not one could be provided for her. Also discussed that the earliest she could receive the Tdap vaccine was after 26 weeks. Patient understood.  - Vaccine record from [employer] site provided for patient for class.  - Also needed TB test within the last month. Quantiferon Gold drawn today.    Preterm labor symptoms and general obstetric precautions including but not limited to vaginal bleeding, contractions, leaking of fluid and fetal movement were reviewed in detail with the patient. Please refer to After Visit Summary for other counseling recommendations.   Return in about 4 weeks (around 04/20/2022) for HROB.  Future Appointments  Date Time Provider [place]  04/09/2022 10:15 AM [facility] NURSE [facility]  04/09/2022 10:30 AM [facility] US3 [facility]  04/23/2022  1:15 PM Chancy Milroy, MD [facility]    Jacquiline Doe, CNM

## 2022-03-27 LAB — QUANTIFERON-TB GOLD PLUS
QuantiFERON Mitogen Value: >10 IU/mL
QuantiFERON Nil Value: 0.04 IU/mL
QuantiFERON TB1 Ag Value: 0.07 IU/mL
QuantiFERON TB2 Ag Value: 0.07 IU/mL
QuantiFERON-TB Gold Plus: NEGATIVE

## 2022-04-02 ENCOUNTER — Encounter: Payer: Self-pay | Admitting: General Practice

## 2022-04-02 ENCOUNTER — Telehealth: Payer: Self-pay | Admitting: General Practice

## 2022-04-02 NOTE — Telephone Encounter (Signed)
Called patient to review horizon results- no answer. Left message to call us back for results.

## 2022-04-03 NOTE — Telephone Encounter (Signed)
Pt was seen 09/18 for routine OB appointment. At this time Horizon results were given and discussed. Partner test was given to patient and explained how to use. Pt verbalized understanding of plan of care.

## 2022-04-09 ENCOUNTER — Encounter: Payer: Self-pay | Admitting: *Deleted

## 2022-04-09 ENCOUNTER — Other Ambulatory Visit: Payer: Self-pay | Admitting: *Deleted

## 2022-04-09 ENCOUNTER — Ambulatory Visit: Payer: Medicaid Other | Attending: Student

## 2022-04-09 ENCOUNTER — Ambulatory Visit: Payer: Medicaid Other | Admitting: *Deleted

## 2022-04-09 VITALS — BP 99/48 | HR 86

## 2022-04-09 DIAGNOSIS — Z362 Encounter for other antenatal screening follow-up: Secondary | ICD-10-CM

## 2022-04-09 DIAGNOSIS — Z8759 Personal history of other complications of pregnancy, childbirth and the puerperium: Secondary | ICD-10-CM

## 2022-04-09 DIAGNOSIS — O99212 Obesity complicating pregnancy, second trimester: Secondary | ICD-10-CM

## 2022-04-09 DIAGNOSIS — O0990 Supervision of high risk pregnancy, unspecified, unspecified trimester: Secondary | ICD-10-CM | POA: Diagnosis not present

## 2022-04-09 DIAGNOSIS — O09299 Supervision of pregnancy with other poor reproductive or obstetric history, unspecified trimester: Secondary | ICD-10-CM

## 2022-04-15 DIAGNOSIS — Z419 Encounter for procedure for purposes other than remedying health state, unspecified: Secondary | ICD-10-CM | POA: Diagnosis not present

## 2022-04-23 ENCOUNTER — Encounter: Payer: Medicaid Other | Admitting: Family Medicine

## 2022-04-24 ENCOUNTER — Encounter: Payer: Medicaid Other | Admitting: Family Medicine

## 2022-05-01 ENCOUNTER — Other Ambulatory Visit: Payer: Self-pay

## 2022-05-01 ENCOUNTER — Encounter: Payer: Self-pay | Admitting: Family Medicine

## 2022-05-01 ENCOUNTER — Ambulatory Visit (INDEPENDENT_AMBULATORY_CARE_PROVIDER_SITE_OTHER): Payer: Medicaid Other | Admitting: Family Medicine

## 2022-05-01 VITALS — BP 121/77 | HR 91 | Wt 205.2 lb

## 2022-05-01 DIAGNOSIS — Z8759 Personal history of other complications of pregnancy, childbirth and the puerperium: Secondary | ICD-10-CM

## 2022-05-01 DIAGNOSIS — Z021 Encounter for pre-employment examination: Secondary | ICD-10-CM

## 2022-05-01 DIAGNOSIS — O0990 Supervision of high risk pregnancy, unspecified, unspecified trimester: Secondary | ICD-10-CM

## 2022-05-01 DIAGNOSIS — D563 Thalassemia minor: Secondary | ICD-10-CM

## 2022-05-01 NOTE — Patient Instructions (Signed)

## 2022-05-01 NOTE — Progress Notes (Signed)
   Subjective:  Julie [name] is a 31 y.o. [phone] at [redacted]w[redacted]d being seen today for ongoing prenatal care.  She is currently monitored for the following issues for this high-risk pregnancy and has Supervision of high risk pregnancy, antepartum; History of pre-eclampsia; and Alpha thalassemia silent carrier on their problem list.  Patient reports no complaints.  Contractions: Irritability. Vag. Bleeding: None.  Movement: Present. Denies leaking of fluid.   The following portions of the patient's history were reviewed and updated as appropriate: allergies, current medications, past family history, past medical history, past social history, past surgical history and problem list. Problem list updated.  Objective:   Vitals:   05/01/22 0947  BP: 121/77  Pulse: 91  Weight: 205 lb 3.2 oz (93.1 kg)    Fetal Status: Fetal Heart Rate (bpm): 143   Movement: Present     General:  Alert, oriented and cooperative. Patient is in no acute distress.  Skin: Skin is warm and dry. No rash noted.   Cardiovascular: Normal heart rate noted  Respiratory: Normal respiratory effort, no problems with respiration noted  Abdomen: Soft, gravid, appropriate for gestational age. Pain/Pressure: Absent     Pelvic: Vag. Bleeding: None     Cervical exam deferred        Extremities: Normal range of motion.  Edema: None  Mental Status: Normal mood and affect. Normal behavior. Normal judgment and thought content.   Urinalysis:      Assessment and Plan:  Pregnancy: [id number] at [redacted]w[redacted]d  1. Alpha thalassemia silent carrier Offered partner testing, declines  2. 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 History of pre-eclampsia Not taking ASA, strongly encouraged to take for PIH prevention  Preterm labor symptoms and general obstetric precautions including but not limited to vaginal bleeding, contractions,  leaking of fluid and fetal movement were reviewed in detail with the patient. Please refer to After Visit Summary for other counseling recommendations.  Return in 4 weeks (on 05/29/2022) for [facility], ob visit.   Clarnce Flock, MD

## 2022-05-04 LAB — TOXASSURE SELECT 13 (MW), URINE

## 2022-05-11 ENCOUNTER — Other Ambulatory Visit: Payer: Self-pay | Admitting: *Deleted

## 2022-05-11 ENCOUNTER — Ambulatory Visit: Payer: Medicaid Other | Attending: Obstetrics

## 2022-05-11 ENCOUNTER — Ambulatory Visit: Payer: Medicaid Other | Admitting: *Deleted

## 2022-05-11 VITALS — BP 107/69 | HR 96

## 2022-05-11 DIAGNOSIS — Z8759 Personal history of other complications of pregnancy, childbirth and the puerperium: Secondary | ICD-10-CM

## 2022-05-11 DIAGNOSIS — O0990 Supervision of high risk pregnancy, unspecified, unspecified trimester: Secondary | ICD-10-CM | POA: Diagnosis not present

## 2022-05-11 DIAGNOSIS — Z362 Encounter for other antenatal screening follow-up: Secondary | ICD-10-CM | POA: Diagnosis not present

## 2022-05-11 DIAGNOSIS — O99212 Obesity complicating pregnancy, second trimester: Secondary | ICD-10-CM | POA: Insufficient documentation

## 2022-05-11 DIAGNOSIS — O09299 Supervision of pregnancy with other poor reproductive or obstetric history, unspecified trimester: Secondary | ICD-10-CM

## 2022-05-11 DIAGNOSIS — Z3689 Encounter for other specified antenatal screening: Secondary | ICD-10-CM

## 2022-05-16 DIAGNOSIS — Z419 Encounter for procedure for purposes other than remedying health state, unspecified: Secondary | ICD-10-CM | POA: Diagnosis not present

## 2022-05-29 ENCOUNTER — Other Ambulatory Visit: Payer: Self-pay

## 2022-05-29 ENCOUNTER — Ambulatory Visit (INDEPENDENT_AMBULATORY_CARE_PROVIDER_SITE_OTHER): Payer: Medicaid Other

## 2022-05-29 VITALS — BP 123/70 | HR 104 | Wt 205.1 lb

## 2022-05-29 DIAGNOSIS — D563 Thalassemia minor: Secondary | ICD-10-CM

## 2022-05-29 DIAGNOSIS — Z8759 Personal history of other complications of pregnancy, childbirth and the puerperium: Secondary | ICD-10-CM

## 2022-05-29 DIAGNOSIS — O0990 Supervision of high risk pregnancy, unspecified, unspecified trimester: Secondary | ICD-10-CM

## 2022-05-29 DIAGNOSIS — O0992 Supervision of high risk pregnancy, unspecified, second trimester: Secondary | ICD-10-CM

## 2022-05-29 DIAGNOSIS — Z3A26 26 weeks gestation of pregnancy: Secondary | ICD-10-CM

## 2022-05-29 NOTE — Progress Notes (Signed)
   PRENATAL VISIT NOTE  Subjective:  Julie [name] is a 31 y.o. [phone] at [redacted]w[redacted]d being seen today for ongoing prenatal care.  She is currently monitored for the following issues for this high-risk pregnancy and has Supervision of high risk pregnancy, antepartum; History of pre-eclampsia; and Alpha thalassemia silent carrier on their problem list.  Patient reports occasionally dizzy. Occurs usually with position changes, standing quickly. Patient reports drinking approximately 2 bottles of water per day. Contractions: Irregular. Vag. Bleeding: None.  Movement: Present. Denies leaking of fluid.   The following portions of the patient's history were reviewed and updated as appropriate: allergies, current medications, past family history, past medical history, past social history, past surgical history and problem list.   Objective:   Vitals:   05/29/22 1130  BP: 123/70  Pulse: (!) 104  Weight: 205 lb 1.6 oz (93 kg)    Fetal Status: Fetal Heart Rate (bpm): 155 Fundal Height: 27 cm Movement: Present     General:  Alert, oriented and cooperative. Patient is in no acute distress.  Skin: Skin is warm and dry. No rash noted.   Cardiovascular: Normal heart rate noted  Respiratory: Normal respiratory effort, no problems with respiration noted  Abdomen: Soft, gravid, appropriate for gestational age.  Pain/Pressure: Absent     Pelvic: Cervical exam deferred        Extremities: Normal range of motion.  Edema: None  Mental Status: Normal mood and affect. Normal behavior. Normal judgment and thought content.   Assessment and Plan:  Pregnancy: [id number] at [redacted]w[redacted]d 1. Supervision of high risk pregnancy, antepartum - Routine OB. Doing well. - Reports occasional dizziness, resolves with sitting, no other symptoms. Likely orthostatic. Encouraged to change positions slowly as well as increase PO water intake as she only drinks 2 bottles of water per day. Reviewed warning s/s and when to seek  help immediately - GTT and labs next visit - Anticipatory guidance for upcoming appointments provided  2. [redacted] weeks gestation of pregnancy - FH appropriate - Endorses active fetal movement  3. History of pre-eclampsia - BP normotensive - Asymptomatic - Continue bASA  4. Alpha thalassemia silent carrier - Previously offered partner testing and declined   Preterm labor symptoms and general obstetric precautions including but not limited to vaginal bleeding, contractions, leaking of fluid and fetal movement were reviewed in detail with the patient. Please refer to After Visit Summary for other counseling recommendations.   Return in about 2 weeks (around 06/12/2022) for LOB.  Future Appointments  Date Time Provider Department Center  06/13/2022  8:20 AM [facility]  06/13/2022  9:55 AM Anyanwu, Jethro Bastos, MD [facility]  07/13/2022  8:30 AM [facility] NURSE [facility]  07/13/2022  8:45 AM [facility] US4 [facility]    Brand Males, CNM

## 2022-06-11 ENCOUNTER — Other Ambulatory Visit: Payer: Self-pay

## 2022-06-11 DIAGNOSIS — O0990 Supervision of high risk pregnancy, unspecified, unspecified trimester: Secondary | ICD-10-CM

## 2022-06-13 ENCOUNTER — Other Ambulatory Visit: Payer: Self-pay

## 2022-06-13 ENCOUNTER — Ambulatory Visit (INDEPENDENT_AMBULATORY_CARE_PROVIDER_SITE_OTHER): Payer: Medicaid Other | Admitting: Obstetrics & Gynecology

## 2022-06-13 ENCOUNTER — Encounter: Payer: Self-pay | Admitting: Obstetrics & Gynecology

## 2022-06-13 ENCOUNTER — Other Ambulatory Visit: Payer: Medicaid Other

## 2022-06-13 VITALS — BP 115/67 | HR 93 | Wt 208.5 lb

## 2022-06-13 DIAGNOSIS — Z3A28 28 weeks gestation of pregnancy: Secondary | ICD-10-CM

## 2022-06-13 DIAGNOSIS — O368330 Maternal care for abnormalities of the fetal heart rate or rhythm, third trimester, not applicable or unspecified: Secondary | ICD-10-CM

## 2022-06-13 DIAGNOSIS — Z23 Encounter for immunization: Secondary | ICD-10-CM | POA: Diagnosis not present

## 2022-06-13 DIAGNOSIS — O0993 Supervision of high risk pregnancy, unspecified, third trimester: Secondary | ICD-10-CM

## 2022-06-13 DIAGNOSIS — O368390 Maternal care for abnormalities of the fetal heart rate or rhythm, unspecified trimester, not applicable or unspecified: Secondary | ICD-10-CM

## 2022-06-13 DIAGNOSIS — O0990 Supervision of high risk pregnancy, unspecified, unspecified trimester: Secondary | ICD-10-CM

## 2022-06-13 DIAGNOSIS — Z8759 Personal history of other complications of pregnancy, childbirth and the puerperium: Secondary | ICD-10-CM

## 2022-06-13 NOTE — Progress Notes (Signed)
   PRENATAL VISIT NOTE  Subjective:  Julie [name] is a 31 y.o. [phone] at [redacted]w[redacted]d being seen today for ongoing prenatal care.  She is currently monitored for the following issues for this high-risk pregnancy and has Supervision of high risk pregnancy, antepartum; History of pre-eclampsia; and Alpha thalassemia silent carrier on their problem list.  Patient reports no complaints.  Contractions: Not present. Vag. Bleeding: None.  Movement: Present. Denies leaking of fluid.   The following portions of the patient's history were reviewed and updated as appropriate: allergies, current medications, past family history, past medical history, past social history, past surgical history and problem list.   Objective:   Vitals:   06/13/22 0916  BP: 115/67  Pulse: 93  Weight: 208 lb 8 oz (94.6 kg)    Fetal Status:     Movement: Present     General:  Alert, oriented and cooperative. Patient is in no acute distress.  Skin: Skin is warm and dry. No rash noted.   Cardiovascular: Normal heart rate noted  Respiratory: Normal respiratory effort, no problems with respiration noted  Abdomen: Soft, gravid, appropriate for gestational age.  Pain/Pressure: Absent     Pelvic: Cervical exam deferred        Extremities: Normal range of motion.  Edema: None  Mental Status: Normal mood and affect. Normal behavior. Normal judgment and thought content.   Assessment and Plan:  Pregnancy: G5P3013 at [redacted]w[redacted]d 1. Abnormal fetal heart rate affecting pregnancy FHR doppler was 90s-100s, NST ordered.  - Fetal nonstress test was reassuring for gestational age, normal baseline.  FM precautions reviewed with patient.   2. History of pre-eclampsia Normal BP, no symptoms  3. [redacted] weeks gestation of pregnancy 4. Supervision of high risk pregnancy, antepartum Third trimester labs and Tdap today - Tdap vaccine greater than or equal to 7yo IM Preterm labor symptoms and general obstetric precautions including but  not limited to vaginal bleeding, contractions, leaking of fluid and fetal movement were reviewed in detail with the patient. Please refer to After Visit Summary for other counseling recommendations.   Return in about 2 weeks (around 06/27/2022) for OFFICE OB VISIT (MD or APP).  Future Appointments  Date Time Provider Department Center  06/13/2022  9:55 AM Ziare Cryder, Jethro Bastos, MD [facility]  07/13/2022  8:30 AM [facility] NURSE [facility]  07/13/2022  8:45 AM [facility] US4 [facility]    Jaynie Collins, MD

## 2022-06-13 NOTE — Patient Instructions (Signed)
TDaP Vaccine Pregnancy Get the Whooping Cough Vaccine While You Are Pregnant (CDC)  It is important for women to get the whooping cough vaccine in the third trimester of each pregnancy. Vaccines are the best way to prevent this disease. There are 2 different whooping cough vaccines. Both vaccines combine protection against whooping cough, tetanus and diphtheria, but they are for different age groups: Tdap: for everyone 11 years or older, including pregnant women  DTaP: for children 2 months through 6 years of age  You need the whooping cough vaccine during each of your pregnancies The recommended time to get the shot is during your 27th through 36th week of pregnancy, preferably during the earlier part of this time period. The Centers for Disease Control and Prevention (CDC) recommends that pregnant women receive the whooping cough vaccine for adolescents and adults (called Tdap vaccine) during the third trimester of each pregnancy. The recommended time to get the shot is during your 27th through 36th week of pregnancy, preferably during the earlier part of this time period. This replaces the original recommendation that pregnant women get the vaccine only if they had not previously received it. The American College of Obstetricians and Gynecologists and the American College of Nurse-Midwives support this recommendation.  You should get the whooping cough vaccine while pregnant to pass protection to your baby frame support disabled and/or not supported in this browser  Learn why Julie [name] decided to get the whooping cough vaccine in her 3rd trimester of pregnancy and how her baby girl was born with some protection against the disease. Also available on YouTube. After receiving the whooping cough vaccine, your body will create protective antibodies (proteins produced by the body to fight off diseases) and pass some of them to your baby before birth. These antibodies provide your baby some short-term  protection against whooping cough in early life. These antibodies can also protect your baby from some of the more serious complications that come along with whooping cough. Your protective antibodies are at their highest about 2 weeks after getting the vaccine, but it takes time to pass them to your baby. So the preferred time to get the whooping cough vaccine is early in your third trimester. The amount of whooping cough antibodies in your body decreases over time. That is why CDC recommends you get a whooping cough vaccine during each pregnancy. Doing so allows each of your babies to get the greatest number of protective antibodies from you. This means each of your babies will get the best protection possible against this disease.  Getting the whooping cough vaccine while pregnant is better than getting the vaccine after you give birth Whooping cough vaccination during pregnancy is ideal so your baby will have short-term protection as soon as he is born. This early protection is important because your baby will not start getting his whooping cough vaccines until he is 2 months old. These first few months of life are when your baby is at greatest risk for catching whooping cough. This is also when he's at greatest risk for having severe, potentially life-threating complications from the infection. To avoid that gap in protection, it is best to get a whooping cough vaccine during pregnancy. You will then pass protection to your baby before he is born. To continue protecting your baby, he should get whooping cough vaccines starting at 2 months old. You may never have gotten the Tdap vaccine before and did not get it during this pregnancy. If so, you should make sure   to get the vaccine immediately after you give birth, before leaving the hospital or birthing center. It will take about 2 weeks before your body develops protection (antibodies) in response to the vaccine. Once you have protection from the vaccine,  you are less likely to give whooping cough to your newborn while caring for him. But remember, your baby will still be at risk for catching whooping cough from others. A recent study looked to see how effective Tdap was at preventing whooping cough in babies whose mothers got the vaccine while pregnant or in the hospital after giving birth. The study found that getting Tdap between 27 through 36 weeks of pregnancy is 85% more effective at preventing whooping cough in babies younger than 2 months old. Blood tests cannot tell if you need a whooping cough vaccine There are no blood tests that can tell you if you have enough antibodies in your body to protect yourself or your baby against whooping cough. Even if you have been sick with whooping cough in the past or previously received the vaccine, you still should get the vaccine during each pregnancy. Breastfeeding may pass some protective antibodies onto your baby By breastfeeding, you may pass some antibodies you have made in response to the vaccine to your baby. When you get a whooping cough vaccine during your pregnancy, you will have antibodies in your breast milk that you can share with your baby as soon as your milk comes in. However, your baby will not get protective antibodies immediately if you wait to get the whooping cough vaccine until after delivering your baby. This is because it takes about 2 weeks for your body to create antibodies. Learn more about the health benefits of breastfeeding.  

## 2022-06-14 ENCOUNTER — Other Ambulatory Visit: Payer: Self-pay | Admitting: Obstetrics & Gynecology

## 2022-06-14 ENCOUNTER — Encounter: Payer: Self-pay | Admitting: Obstetrics & Gynecology

## 2022-06-14 DIAGNOSIS — O24419 Gestational diabetes mellitus in pregnancy, unspecified control: Secondary | ICD-10-CM

## 2022-06-14 DIAGNOSIS — O99013 Anemia complicating pregnancy, third trimester: Secondary | ICD-10-CM

## 2022-06-14 LAB — CBC
Hematocrit: 31.9 % — ABNORMAL LOW (ref 34.0–46.6)
Hemoglobin: 9.8 g/dL — ABNORMAL LOW (ref 11.1–15.9)
MCH: 23.5 pg — ABNORMAL LOW (ref 26.6–33.0)
MCHC: 30.7 g/dL — ABNORMAL LOW (ref 31.5–35.7)
MCV: 77 fL — ABNORMAL LOW (ref 79–97)
Platelets: 269 10*3/uL (ref 150–450)
RBC: 4.17 x10E6/uL (ref 3.77–5.28)
RDW: 14.3 % (ref 11.7–15.4)
WBC: 7.6 10*3/uL (ref 3.4–10.8)

## 2022-06-14 LAB — GLUCOSE TOLERANCE, 2 HOURS W/ 1HR
Glucose, 1 hour: 117 mg/dL (ref 70–179)
Glucose, 2 hour: 98 mg/dL (ref 70–152)
Glucose, Fasting: 114 mg/dL — ABNORMAL HIGH (ref 70–91)

## 2022-06-14 LAB — ANTIBODY SCREEN: Antibody Screen: NEGATIVE

## 2022-06-14 LAB — RPR: RPR Ser Ql: NONREACTIVE

## 2022-06-14 LAB — HIV ANTIBODY (ROUTINE TESTING W REFLEX): HIV Screen 4th Generation wRfx: NONREACTIVE

## 2022-06-14 MED ORDER — ACCU-CHEK GUIDE W/DEVICE KIT: 1.0000 | PACK | Freq: Four times a day (QID) | 0 refills | Status: DC

## 2022-06-14 MED ORDER — ACCU-CHEK SOFTCLIX LANCETS MISC: 12 refills | Status: DC

## 2022-06-14 MED ORDER — ACCU-CHEK GUIDE VI STRP: ORAL_STRIP | 12 refills | Status: DC

## 2022-06-15 ENCOUNTER — Telehealth: Payer: Self-pay | Admitting: *Deleted

## 2022-06-15 DIAGNOSIS — O24419 Gestational diabetes mellitus in pregnancy, unspecified control: Secondary | ICD-10-CM

## 2022-06-15 DIAGNOSIS — Z419 Encounter for procedure for purposes other than remedying health state, unspecified: Secondary | ICD-10-CM | POA: Diagnosis not present

## 2022-06-15 NOTE — Telephone Encounter (Addendum)
-----   Message from Tereso Newcomer, MD sent at 06/14/2022  8:15 PM EST ----- Patient's 2 hr GTT is abnormal and is consistent with Gestational Diabetes DM education referral and testing supplies ordered. Please call to inform patient of results and recommendations.  12/1  1235  Called pt and she did not answer.  Message left stating that I am calling with non-urgent test result information. We will call her back on Monday. Pt can be informed that Diabetes Ed appt has been scheduled for 12/12 @ 1015.

## 2022-06-19 MED ORDER — ACCU-CHEK SOFTCLIX LANCETS MISC: 12 refills | Status: DC

## 2022-06-19 NOTE — Telephone Encounter (Signed)
Called patient and informed her of results and appt with diabetes education. Patient verbalized understanding & states she has already picked up her testing supplies. Advised patient to bring those to her appt on Tuesday. Patient verbalized understanding.

## 2022-06-26 ENCOUNTER — Encounter: Payer: Medicaid Other | Attending: Obstetrics & Gynecology | Admitting: Registered"

## 2022-06-26 ENCOUNTER — Ambulatory Visit (INDEPENDENT_AMBULATORY_CARE_PROVIDER_SITE_OTHER): Payer: Medicaid Other | Admitting: Registered"

## 2022-06-26 ENCOUNTER — Other Ambulatory Visit: Payer: Self-pay

## 2022-06-26 DIAGNOSIS — Z713 Dietary counseling and surveillance: Secondary | ICD-10-CM | POA: Diagnosis not present

## 2022-06-26 DIAGNOSIS — O24419 Gestational diabetes mellitus in pregnancy, unspecified control: Secondary | ICD-10-CM | POA: Diagnosis not present

## 2022-06-26 DIAGNOSIS — Z3A30 30 weeks gestation of pregnancy: Secondary | ICD-10-CM

## 2022-06-26 NOTE — Progress Notes (Signed)
Patient was seen for Gestational Diabetes self-management on 06/26/2022  Start time 1017 and End time 1110   Estimated due date: 09/03/22; [redacted]w[redacted]d  Clinical: Medications: reviewed Medical History: h/o pre-eclampsia, Pt reports anemia Labs: OGTT FBS 114, A1c 5.6% on 02/22/22  Dietary and Lifestyle History: Pt reports that she usually eats 2 meals per day, usually not hungry at night. Sometimes has just a bowl of frosted flakes at night. Pt has fruit for snacks.  Pt states she has changed her diet since diagnosis and eating less rice, noodles, only 1/2 croissant instead of whole croissant for breakfast.  Pt states she cooks most of her meals at home, eats a variety of vegetables. Pt states she does not like yogurt.  Pt reports bitter taste in mouth when goes many hours without eating.  Physical Activity: ADL, fatigue (anemia) is barrier to more exercise Stress: not assessed Sleep: not assessed  24 hr Recall:  First Meal: 1/2 croissant,  black tea with ginger and cloves, Snack: Second meal: plantain, spinach, stew, eggs Snack: Third meal: cobb salad with fried chicken, no bacon, honey mustard dressing (1-2 carb choices) Snack: Beverages: water, tea, "a little" 100% apple juice   NUTRITION INTERVENTION  Nutrition education (E-1) on the following topics:   Initial Follow-up  [x]  []  Definition of Gestational Diabetes []  []  Why dietary management is important in controlling blood glucose [x]  []  Effects each nutrient has on blood glucose levels [x]  []  Simple carbohydrates vs complex carbohydrates []  []  Fluid intake [x]  []  Creating a balanced meal plan [x]  []  Carbohydrate counting  [x]  []  When to check blood glucose levels [x]  []  Proper blood glucose monitoring techniques [x]  []  Effect of stress and stress reduction techniques  [x]  []  Exercise effect on blood glucose levels, appropriate exercise during pregnancy [x]  []  Importance of limiting caffeine and abstaining from alcohol  and smoking [x]  []  Medications used for blood sugar control during pregnancy []  []  Hypoglycemia and rule of 15 []  []  Postpartum self care  Patient has a meter prior to visit. Patient is testing fasting and 1 hours after finishing each meal. FBS: mostly elevated Postprandial: ~50% WNL  Patient instructed to monitor glucose levels: FBS: 60 - ? 95 mg/dL (some clinics use 90 for cutoff) 1 hour: ? 140 mg/dL 2 hour: ? mg/dL  Patient received handouts: Nutrition Diabetes and Pregnancy Carbohydrate Counting List  Patient will be seen for follow-up as needed.

## 2022-06-28 ENCOUNTER — Encounter: Payer: Self-pay | Admitting: Advanced Practice Midwife

## 2022-06-28 ENCOUNTER — Other Ambulatory Visit: Payer: Self-pay

## 2022-06-28 ENCOUNTER — Ambulatory Visit (INDEPENDENT_AMBULATORY_CARE_PROVIDER_SITE_OTHER): Payer: Medicaid Other | Admitting: Advanced Practice Midwife

## 2022-06-28 VITALS — BP 120/71 | HR 115 | Wt 203.6 lb

## 2022-06-28 DIAGNOSIS — O24419 Gestational diabetes mellitus in pregnancy, unspecified control: Secondary | ICD-10-CM

## 2022-06-28 DIAGNOSIS — Z3A30 30 weeks gestation of pregnancy: Secondary | ICD-10-CM

## 2022-06-28 DIAGNOSIS — O0990 Supervision of high risk pregnancy, unspecified, unspecified trimester: Secondary | ICD-10-CM

## 2022-06-28 DIAGNOSIS — O0993 Supervision of high risk pregnancy, unspecified, third trimester: Secondary | ICD-10-CM

## 2022-06-28 NOTE — Patient Instructions (Signed)

## 2022-06-28 NOTE — Progress Notes (Signed)
   PRENATAL VISIT NOTE  Subjective:  Julie [name] is a 31 y.o. [phone] at [redacted]w[redacted]d being seen today for ongoing prenatal care.  She is currently monitored for the following issues for this high-risk pregnancy and has Supervision of high risk pregnancy, antepartum; History of pre-eclampsia; Alpha thalassemia silent carrier; and Gestational diabetes mellitus, antepartum on their problem list.  Patient reports no complaints.  Contractions: Irritability. Vag. Bleeding: None.  Movement: Present. Denies leaking of fluid.   The following portions of the patient's history were reviewed and updated as appropriate: allergies, current medications, past family history, past medical history, past social history, past surgical history and problem list.   Objective:   Vitals:   06/28/22 1535  BP: 120/71  Pulse: (!) 115  Weight: 203 lb 9.6 oz (92.4 kg)    Fetal Status: Fetal Heart Rate (bpm): 135   Movement: Present     General:  Alert, oriented and cooperative. Patient is in no acute distress.  Skin: Skin is warm and dry. No rash noted.   Cardiovascular: Normal heart rate noted  Respiratory: Normal respiratory effort, no problems with respiration noted  Abdomen: Soft, gravid, appropriate for gestational age.  Pain/Pressure: Present     Pelvic: Cervical exam deferred        Extremities: Normal range of motion.     Mental Status: Normal mood and affect. Normal behavior. Normal judgment and thought content.   Assessment and Plan:  Pregnancy: [id number] at [redacted]w[redacted]d 1. Supervision of high risk pregnancy, antepartum - routine care  2. [redacted] weeks gestation of pregnancy   3. Gestational diabetes mellitus (GDM), antepartum, gestational diabetes method of control unspecified CBG Log: Fastings: 91, 91, 98, All of these were prior to meeting with DM educator 99, 93, 108, 85, 101, 97  2 hour PP: 132, 120, 95, 119, 122, 110, 104, 132, 116, 123, 113, 121, 98, 92, 118, 117  Preterm labor symptoms  and general obstetric precautions including but not limited to vaginal bleeding, contractions, leaking of fluid and fetal movement were reviewed in detail with the patient. Please refer to After Visit Summary for other counseling recommendations.   Return in about 2 weeks (around 07/12/2022).  Future Appointments  Date Time Provider Department Center  07/11/2022  2:55 PM [facility]  07/12/2022  9:15 AM [facility]  07/13/2022  8:30 AM [facility] NURSE [facility]  07/13/2022  8:45 AM [facility] US4 [facility]  07/26/2022  3:55 PM Autry-Lott, Edwyna [facility]  08/09/2022  9:35 AM Bernerd Limbo, CNM [facility]     Thressa Sheller DNP, CNM  06/28/22  3:52 PM

## 2022-07-11 ENCOUNTER — Ambulatory Visit (INDEPENDENT_AMBULATORY_CARE_PROVIDER_SITE_OTHER): Payer: Medicaid Other | Admitting: Advanced Practice Midwife

## 2022-07-11 ENCOUNTER — Other Ambulatory Visit: Payer: Self-pay

## 2022-07-11 VITALS — BP 112/76 | HR 97 | Wt 203.0 lb

## 2022-07-11 DIAGNOSIS — O0993 Supervision of high risk pregnancy, unspecified, third trimester: Secondary | ICD-10-CM

## 2022-07-11 DIAGNOSIS — D563 Thalassemia minor: Secondary | ICD-10-CM

## 2022-07-11 DIAGNOSIS — Z3A32 32 weeks gestation of pregnancy: Secondary | ICD-10-CM

## 2022-07-11 DIAGNOSIS — Z8759 Personal history of other complications of pregnancy, childbirth and the puerperium: Secondary | ICD-10-CM

## 2022-07-11 DIAGNOSIS — O24410 Gestational diabetes mellitus in pregnancy, diet controlled: Secondary | ICD-10-CM

## 2022-07-11 DIAGNOSIS — O24419 Gestational diabetes mellitus in pregnancy, unspecified control: Secondary | ICD-10-CM

## 2022-07-11 DIAGNOSIS — O0990 Supervision of high risk pregnancy, unspecified, unspecified trimester: Secondary | ICD-10-CM

## 2022-07-11 NOTE — Patient Instructions (Signed)
Add bedtime snack with protein to see if we can get more than half of fasting blood sugars less than 95.

## 2022-07-11 NOTE — Progress Notes (Signed)
   PRENATAL VISIT NOTE  Subjective:  Julie [name] is a 31 y.o. [phone] at [redacted]w[redacted]d being seen today for ongoing prenatal care.  She is currently monitored for the following issues for this high-risk pregnancy and has Supervision of high risk pregnancy, antepartum; History of pre-eclampsia; Alpha thalassemia silent carrier; and Gestational diabetes mellitus, antepartum on their problem list.  Patient reports no bleeding, no cramping, and no leaking.  Contractions: Irritability. Vag. Bleeding: None.  Movement: Present. Denies leaking of fluid.   The following portions of the patient's history were reviewed and updated as appropriate: allergies, current medications, past family history, past medical history, past social history, past surgical history and problem list.   Objective:   Vitals:   07/11/22 1526  BP: 112/76  Pulse: 97  Weight: 203 lb (92.1 kg)    Fetal Status: Fetal Heart Rate (bpm): 132   Movement: Present     General:  Alert, oriented and cooperative. Patient is in no acute distress.  Skin: Skin is warm and dry. No rash noted.   Cardiovascular: Normal heart rate noted  Respiratory: Normal respiratory effort, no problems with respiration noted  Abdomen: Soft, gravid, appropriate for gestational age.  Pain/Pressure: Present     Pelvic: Cervical exam deferred        Extremities: Normal range of motion.  Edema: None  Mental Status: Normal mood and affect. Normal behavior. Normal judgment and thought content.   Assessment and Plan:  Pregnancy: G5P3013 at [redacted]w[redacted]d 1. Diet controlled gestational diabetes mellitus (GDM), antepartum - 60 % Fasting CBGs mildly elevated. Postprandial CBGS in range. Discussed adding Metformin QHS vs adding QHS snack with protein and increasing physical activity. Pt would like to try the later before starting meds. Lengthy discussion about importance of testing blood sugars, bringing log book and keeping appointments. Discussed risks of  uncontrolled DM in pregnancy including delayed fetal lung maturity, IUFD and shoulder dystocia possibly resulting brachial plexus palsy, brain damage, intrapartum death and extensive obstetric lacerations. Patient verbalizes understanding.   2. Supervision of high risk pregnancy, antepartum   3. History of pre-eclampsia - BP Nml - On ASA  4. Alpha thalassemia silent carrier - will ask partner  5. [redacted] weeks gestation of pregnancy   Preterm labor symptoms and general obstetric precautions including but not limited to vaginal bleeding, contractions, leaking of fluid and fetal movement were reviewed in detail with the patient. Please refer to After Visit Summary for other counseling recommendations.   No follow-ups on file.  Future Appointments  Date Time Provider Department Center  07/12/2022  9:15 AM [facility]  07/13/2022  8:30 AM [facility] NURSE [facility]  07/13/2022  8:45 AM [facility] US4 [facility]  07/26/2022  3:55 PM Autry-Lott, Ed[facility]a [facility]  08/09/2022  9:35 AM Bernerd Limbo, CNM [facility]    Dorathy Kinsman, CNM

## 2022-07-12 ENCOUNTER — Other Ambulatory Visit: Payer: Medicaid Other

## 2022-07-13 ENCOUNTER — Other Ambulatory Visit: Payer: Self-pay | Admitting: *Deleted

## 2022-07-13 ENCOUNTER — Ambulatory Visit: Payer: Medicaid Other | Attending: Obstetrics and Gynecology

## 2022-07-13 ENCOUNTER — Ambulatory Visit: Payer: Medicaid Other | Attending: Obstetrics and Gynecology | Admitting: Obstetrics and Gynecology

## 2022-07-13 ENCOUNTER — Encounter: Payer: Self-pay | Admitting: *Deleted

## 2022-07-13 ENCOUNTER — Ambulatory Visit: Payer: Medicaid Other | Admitting: *Deleted

## 2022-07-13 VITALS — BP 112/70 | HR 92

## 2022-07-13 DIAGNOSIS — O99213 Obesity complicating pregnancy, third trimester: Secondary | ICD-10-CM | POA: Diagnosis not present

## 2022-07-13 DIAGNOSIS — E669 Obesity, unspecified: Secondary | ICD-10-CM | POA: Diagnosis not present

## 2022-07-13 DIAGNOSIS — O24410 Gestational diabetes mellitus in pregnancy, diet controlled: Secondary | ICD-10-CM

## 2022-07-13 DIAGNOSIS — D563 Thalassemia minor: Secondary | ICD-10-CM | POA: Diagnosis not present

## 2022-07-13 DIAGNOSIS — Z3689 Encounter for other specified antenatal screening: Secondary | ICD-10-CM | POA: Insufficient documentation

## 2022-07-13 DIAGNOSIS — E668 Other obesity: Secondary | ICD-10-CM

## 2022-07-13 DIAGNOSIS — O0990 Supervision of high risk pregnancy, unspecified, unspecified trimester: Secondary | ICD-10-CM | POA: Insufficient documentation

## 2022-07-13 DIAGNOSIS — O285 Abnormal chromosomal and genetic finding on antenatal screening of mother: Secondary | ICD-10-CM | POA: Diagnosis not present

## 2022-07-13 DIAGNOSIS — Z8759 Personal history of other complications of pregnancy, childbirth and the puerperium: Secondary | ICD-10-CM | POA: Diagnosis not present

## 2022-07-13 DIAGNOSIS — Z3A32 32 weeks gestation of pregnancy: Secondary | ICD-10-CM | POA: Diagnosis not present

## 2022-07-13 DIAGNOSIS — E6689 Other obesity not elsewhere classified: Secondary | ICD-10-CM

## 2022-07-13 DIAGNOSIS — O09293 Supervision of pregnancy with other poor reproductive or obstetric history, third trimester: Secondary | ICD-10-CM

## 2022-07-13 DIAGNOSIS — O99212 Obesity complicating pregnancy, second trimester: Secondary | ICD-10-CM | POA: Insufficient documentation

## 2022-07-13 NOTE — Progress Notes (Signed)
Maternal-Fetal Medicine   Name: Julie [name] DOB: 1991-02-10 MRN: [id number] Referring Provider: Dorathy Kinsman, CNM  G5 [phone].  Patient is here for fetal growth assessment.  She has a new diagnosis of gestational diabetes.  She had consultation with diabetic educator and started regularly checking her blood glucose.  Postprandial levels are within normal range and fasting levels are getting better and under 95 mg/dL.  Patient does not have hypertension.  Obstetric history significant for 3 term vaginal deliveries.  She did not have gestational diabetes in any of her pregnancies.  One of her pregnancies was complicated by gestational hypertension and patient takes prophylactic low-dose aspirin.  Ultrasound Fetal growth is appropriate for gestational age.  Amniotic fluid is normal and good fetal activity seen.  Cephalic presentation.  Gestational diabetes I explained the diagnosis of gestational diabetes.  I emphasized the importance of good blood glucose control to prevent adverse fetal or neonatal outcomes.  I discussed normal range of blood glucose values. I encouraged her to check her blood glucose regularly.  Possible complications of gestational diabetes include fetal macrosomia, shoulder dystocia and birth injuries, stillbirth (in poorly controlled diabetes) and neonatal respiratory syndrome and other complications.  In about 85% of cases, gestational diabetes is well controlled by diet alone.  Exercise reduces the need for insulin.  Medical treatment includes oral hypoglycemics or insulin.  Timing of delivery: In well-controlled diabetes on diet, patient can be delivered at 30- or 40-weeks' gestation. Vaginal delivery is not contraindicated. Type 2 diabetes develops in up to 50% of women with GDM. I recommend postpartum screening with 75-g glucose load at 6 to 12 weeks after delivery.  Recommendations -An appointment was made for her to return in 4 weeks for fetal  growth assessment. -If patient requires metformin or insulin, we recommend weekly BPP from now till delivery.   Thank you for consultation.  If you have any questions or concerns, please contact me the [facility].  Consultation including face-to-face (more than 50%) counseling 30 minutes.

## 2022-07-16 DIAGNOSIS — Z419 Encounter for procedure for purposes other than remedying health state, unspecified: Secondary | ICD-10-CM | POA: Diagnosis not present

## 2022-07-16 NOTE — L&D Delivery Note (Addendum)
OB/GYN Faculty Practice Delivery Note  Julie [name] is a 32 y.o. [id number] s/p VD at [redacted]w[redacted]d She was admitted for IOL for GDMA1.   ROM: 1h 473mith light meconium fluid GBS Status: Positive/-- (01/25 1323), adequately treated Maximum Maternal Temperature: 98.3  Labor Progress: Initial SVE: 0/thick/-3. She then progressed to complete.   Delivery Date/Time: 08/28/2022 @ 1434 Delivery: Called to room and patient was complete and pushing. Head delivered LOA. No nuchal cord present. Shoulder and body delivered in usual fashion. Infant with spontaneous cry, placed on mother's abdomen, dried and stimulated. Cord clamped x 2 after 1-minute delay, and cut by FOB. Cord blood drawn. Placenta delivered spontaneously with gentle cord traction. Fundus firm with massage and Pitocin. Labia, perineum, vagina, and cervix inspected with no lacerations.  Baby Weight: pending  Placenta: 3 vessel, intact. Sent to L&D Complications: None Lacerations: None EBL: 89 mL Analgesia: Epidural  Infant:  APGAR (1 MIN): 9   APGAR (5 MINS): 9 TregoDO 08/28/2022, 2:48 PM PGY-1, [facility]  ___ GME ATTESTATION:  Evaluation and management procedures were performed by the [facility] Res[facility] under my supervision. I was immediately available for direct supervision, assistance and direction throughout this encounter.  I also confirm that I have verified the information documented in the resident's note, and that I have also personally reperformed the pertinent components of the physical exam and all of the medical decision making activities.  I have also made any necessary editorial changes.  JeShelda PalDO OB Fellow, [facility] [place]/13/2024 3:17 PM

## 2022-07-26 ENCOUNTER — Other Ambulatory Visit: Payer: Self-pay

## 2022-07-26 ENCOUNTER — Ambulatory Visit (INDEPENDENT_AMBULATORY_CARE_PROVIDER_SITE_OTHER): Payer: Medicaid Other | Admitting: Family Medicine

## 2022-07-26 VITALS — Wt 209.0 lb

## 2022-07-26 DIAGNOSIS — Z3A34 34 weeks gestation of pregnancy: Secondary | ICD-10-CM

## 2022-07-26 DIAGNOSIS — O0990 Supervision of high risk pregnancy, unspecified, unspecified trimester: Secondary | ICD-10-CM

## 2022-07-26 DIAGNOSIS — O24410 Gestational diabetes mellitus in pregnancy, diet controlled: Secondary | ICD-10-CM

## 2022-07-26 NOTE — Progress Notes (Signed)
   PRENATAL VISIT NOTE  Subjective:  Julie [name] is a 32 y.o. [phone] at [redacted]w[redacted]d being seen today for ongoing prenatal care.  She is currently monitored for the following issues for this high-risk pregnancy and has Supervision of high risk pregnancy, antepartum; History of pre-eclampsia; Alpha thalassemia silent carrier; and Gestational diabetes mellitus, antepartum on their problem list.  Patient reports no complaints.  Contractions: Irritability. Vag. Bleeding: None.  Movement: Present. Denies leaking of fluid.   The following portions of the patient's history were reviewed and updated as appropriate: allergies, current medications, past family history, past medical history, past social history, past surgical history and problem list.   Objective:   Vitals:   07/26/22 1607  Weight: 209 lb (94.8 kg)    Fetal Status: Fetal Heart Rate (bpm): 158   Movement: Present     General:  Alert, oriented and cooperative. Patient is in no acute distress.  Skin: Skin is warm and dry. No rash noted.   Cardiovascular: Normal heart rate noted  Respiratory: Normal respiratory effort, no problems with respiration noted  Abdomen: Soft, gravid, appropriate for gestational age.  Pain/Pressure: Present     Pelvic: Cervical exam deferred        Extremities: Normal range of motion.  Edema: None  Mental Status: Normal mood and affect. Normal behavior. Normal judgment and thought content.   Assessment and Plan:  Pregnancy: [id number] at [redacted]w[redacted]d 1. Supervision of high risk pregnancy, antepartum No acute concerns.   2. Diet controlled gestational diabetes mellitus (GDM), antepartum Fasting average on phone app is 89. Technically meeting goal. Discussed dietary habits to further improve. [place] 12/29 EFW 61%. F/u scheduled 1/25.   3. [redacted] weeks gestation of pregnancy Follow up in 2 weeks.    Preterm labor symptoms and general obstetric precautions including but not limited to vaginal bleeding,  contractions, leaking of fluid and fetal movement were reviewed in detail with the patient. Please refer to After Visit Summary for other counseling recommendations.   Return in about 2 weeks (around 08/09/2022) for HROB follow up.  Future Appointments  Date Time Provider [place]  08/09/2022  9:35 AM Helaine Chess [facility]  08/09/2022  3:30 PM [facility] NURSE [facility]  08/09/2022  3:45 PM [facility] US4 [facility] [place]    Shanita Kanan Autry-Lott, DO

## 2022-08-09 ENCOUNTER — Other Ambulatory Visit (HOSPITAL_COMMUNITY)
Admission: RE | Admit: 2022-08-09 | Discharge: 2022-08-09 | Disposition: A | Discharge status: Home / Self Care | Payer: Medicaid Other | Source: Ambulatory Visit | Attending: Certified Nurse Midwife | Admitting: Certified Nurse Midwife

## 2022-08-09 ENCOUNTER — Ambulatory Visit: Payer: Medicaid Other | Attending: Obstetrics and Gynecology

## 2022-08-09 ENCOUNTER — Ambulatory Visit (INDEPENDENT_AMBULATORY_CARE_PROVIDER_SITE_OTHER): Payer: Medicaid Other | Admitting: Certified Nurse Midwife

## 2022-08-09 ENCOUNTER — Other Ambulatory Visit: Payer: Self-pay

## 2022-08-09 ENCOUNTER — Encounter: Payer: Self-pay | Admitting: Family Medicine

## 2022-08-09 ENCOUNTER — Ambulatory Visit: Payer: Medicaid Other | Admitting: *Deleted

## 2022-08-09 ENCOUNTER — Encounter: Payer: Self-pay | Admitting: *Deleted

## 2022-08-09 VITALS — BP 111/72 | HR 122 | Wt 210.8 lb

## 2022-08-09 VITALS — BP 111/60 | HR 101

## 2022-08-09 DIAGNOSIS — Z8759 Personal history of other complications of pregnancy, childbirth and the puerperium: Secondary | ICD-10-CM

## 2022-08-09 DIAGNOSIS — O0990 Supervision of high risk pregnancy, unspecified, unspecified trimester: Secondary | ICD-10-CM | POA: Diagnosis not present

## 2022-08-09 DIAGNOSIS — O24410 Gestational diabetes mellitus in pregnancy, diet controlled: Secondary | ICD-10-CM

## 2022-08-09 DIAGNOSIS — Z3A36 36 weeks gestation of pregnancy: Secondary | ICD-10-CM

## 2022-08-09 DIAGNOSIS — O99213 Obesity complicating pregnancy, third trimester: Secondary | ICD-10-CM | POA: Insufficient documentation

## 2022-08-09 DIAGNOSIS — O0993 Supervision of high risk pregnancy, unspecified, third trimester: Secondary | ICD-10-CM

## 2022-08-09 DIAGNOSIS — O09293 Supervision of pregnancy with other poor reproductive or obstetric history, third trimester: Secondary | ICD-10-CM | POA: Diagnosis not present

## 2022-08-09 DIAGNOSIS — O285 Abnormal chromosomal and genetic finding on antenatal screening of mother: Secondary | ICD-10-CM

## 2022-08-09 DIAGNOSIS — E669 Obesity, unspecified: Secondary | ICD-10-CM | POA: Diagnosis not present

## 2022-08-09 DIAGNOSIS — D563 Thalassemia minor: Secondary | ICD-10-CM | POA: Diagnosis not present

## 2022-08-09 NOTE — Progress Notes (Signed)
   PRENATAL VISIT NOTE  Subjective:  Julie [name] is a 32 y.o. [phone] at [redacted]w[redacted]d being seen today for ongoing prenatal care.  She is currently monitored for the following issues for this high-risk pregnancy and has Supervision of high risk pregnancy, antepartum; History of pre-eclampsia; Alpha thalassemia silent carrier; and Gestational diabetes mellitus, antepartum on their problem list.  Patient reports no complaints, reports having a hard time remembering to test post-prandials.  Contractions: Irritability. Vag. Bleeding: None.  Movement: Present. Denies leaking of fluid.   The following portions of the patient's history were reviewed and updated as appropriate: allergies, current medications, past family history, past medical history, past social history, past surgical history and problem list.   Objective:   Vitals:   08/09/22 1008  BP: 111/72  Pulse: (!) 122  Weight: 210 lb 12.8 oz (95.6 kg)   Fetal Status: Fetal Heart Rate (bpm): 142 Fundal Height: 38 cm Movement: Present     General:  Alert, oriented and cooperative. Patient is in no acute distress.  Skin: Skin is warm and dry. No rash noted.   Cardiovascular: Normal heart rate noted  Respiratory: Normal respiratory effort, no problems with respiration noted  Abdomen: Soft, gravid, appropriate for gestational age.  Pain/Pressure: Absent     Pelvic: Cervical exam deferred        Extremities: Normal range of motion.  Edema: None  Mental Status: Normal mood and affect. Normal behavior. Normal judgment and thought content.   Assessment and Plan:  Pregnancy: [id number] at [redacted]w[redacted]d 1. Supervision of high risk pregnancy in third trimester - Doing well, feeling regular and vigorous fetal movement   2. [redacted] weeks gestation of pregnancy - Routine OB care  - GC/Chlamydia probe amp ([place])not at [facility] - Culture, beta strep (group b only)  3. History of gestational hypertension - BP normal today - Taking low dose  aspirin daily  4. Diet controlled gestational diabetes mellitus (GDM) in third trimester - About 80% of fastings within range, but often borderline. Per dietary recall, pt eating a lot of carbs at night. Advised to increase balance of protein with complex carbs and to try not to eat carbs alone before bed. Also reviewed importance of walking, hydration and balanced meals throughout the day (not just 1-2x/day). Pt expressed understanding, knows she needs to be testing more often and agreed to try dietary changes. May need oral med if not controlled better this week + earlier delivery.  Preterm labor symptoms and general obstetric precautions including but not limited to vaginal bleeding, contractions, leaking of fluid and fetal movement were reviewed in detail with the patient. Please refer to After Visit Summary for other counseling recommendations.   Return in about 1 week (around 08/16/2022) for IN-PERSON, [place].  Future Appointments  Date Time Provider [place]  08/09/2022  3:30 PM [facility] NURSE [facility]  08/09/2022  3:45 PM [facility] US4 [facility]  08/16/2022  9:35 AM Hillard Danker, Myles Rosenthal, PA-C [facility]    Gabriel Carina, CNM

## 2022-08-10 LAB — GC/CHLAMYDIA PROBE AMP (~~LOC~~) NOT AT ARMC
Chlamydia: NEGATIVE
Comment: NEGATIVE
Comment: NORMAL
Neisseria Gonorrhea: NEGATIVE

## 2022-08-12 LAB — CULTURE, BETA STREP (GROUP B ONLY): Strep Gp B Culture: POSITIVE — AB

## 2022-08-14 ENCOUNTER — Encounter: Payer: Self-pay | Admitting: Certified Nurse Midwife

## 2022-08-14 DIAGNOSIS — O99820 Streptococcus B carrier state complicating pregnancy: Secondary | ICD-10-CM | POA: Insufficient documentation

## 2022-08-16 ENCOUNTER — Encounter: Payer: Self-pay | Admitting: Medical

## 2022-08-16 ENCOUNTER — Other Ambulatory Visit: Payer: Self-pay

## 2022-08-16 ENCOUNTER — Ambulatory Visit (INDEPENDENT_AMBULATORY_CARE_PROVIDER_SITE_OTHER): Payer: Medicaid Other | Admitting: Medical

## 2022-08-16 VITALS — BP 121/71 | HR 118 | Wt 211.3 lb

## 2022-08-16 DIAGNOSIS — O0993 Supervision of high risk pregnancy, unspecified, third trimester: Secondary | ICD-10-CM

## 2022-08-16 DIAGNOSIS — O24410 Gestational diabetes mellitus in pregnancy, diet controlled: Secondary | ICD-10-CM

## 2022-08-16 DIAGNOSIS — O0990 Supervision of high risk pregnancy, unspecified, unspecified trimester: Secondary | ICD-10-CM

## 2022-08-16 DIAGNOSIS — O99820 Streptococcus B carrier state complicating pregnancy: Secondary | ICD-10-CM

## 2022-08-16 DIAGNOSIS — Z3A37 37 weeks gestation of pregnancy: Secondary | ICD-10-CM

## 2022-08-16 DIAGNOSIS — Z419 Encounter for procedure for purposes other than remedying health state, unspecified: Secondary | ICD-10-CM | POA: Diagnosis not present

## 2022-08-16 DIAGNOSIS — D563 Thalassemia minor: Secondary | ICD-10-CM

## 2022-08-16 DIAGNOSIS — Z8759 Personal history of other complications of pregnancy, childbirth and the puerperium: Secondary | ICD-10-CM

## 2022-08-16 NOTE — Progress Notes (Signed)
Pt has Glucose log on phone.

## 2022-08-16 NOTE — Progress Notes (Signed)
   PRENATAL VISIT NOTE  Subjective:  Julie [name] is a 32 y.o. [phone] at [redacted]w[redacted]d being seen today for ongoing prenatal care.  She is currently monitored for the following issues for this high-risk pregnancy and has Supervision of high risk pregnancy, antepartum; History of pre-eclampsia; Alpha thalassemia silent carrier; Gestational diabetes mellitus, antepartum; and GBS (group B Streptococcus carrier), +RV culture, currently pregnant on their problem list.  Patient reports no complaints.  Contractions: Not present. Vag. Bleeding: None.  Movement: Present. Denies leaking of fluid.   The following portions of the patient's history were reviewed and updated as appropriate: allergies, current medications, past family history, past medical history, past social history, past surgical history and problem list.   Objective:   Vitals:   08/16/22 1002  BP: 121/71  Pulse: (!) 118  Weight: 211 lb 4.8 oz (95.8 kg)    Fetal Status: Fetal Heart Rate (bpm): 148 Fundal Height: 39 cm Movement: Present     General:  Alert, oriented and cooperative. Patient is in no acute distress.  Skin: Skin is warm and dry. No rash noted.   Cardiovascular: Normal heart rate noted  Respiratory: Normal respiratory effort, no problems with respiration noted  Abdomen: Soft, gravid, appropriate for gestational age.  Pain/Pressure: Present     Pelvic: Cervical exam deferred        Extremities: Normal range of motion.  Edema: None  Mental Status: Normal mood and affect. Normal behavior. Normal judgment and thought content.   Assessment and Plan:  Pregnancy: [id number] at [redacted]w[redacted]d 1. Supervision of high risk pregnancy, antepartum  2. History of pre-eclampsia - Normotensive today   3. Diet controlled gestational diabetes mellitus (GDM), antepartum - CBGs reviewed, fasting values only, all normal except 1 for last 2 weeks - No changes in management  - Desires IOL at 39 weeks, will schedule   4. GBS (group B  Streptococcus carrier), +RV culture, currently pregnant - Discussed need for treatment in labor  5. Alpha thalassemia silent carrier  6. [redacted] weeks gestation of pregnancy  Term labor symptoms and general obstetric precautions including but not limited to vaginal bleeding, contractions, leaking of fluid and fetal movement were reviewed in detail with the patient. Please refer to After Visit Summary for other counseling recommendations.   Return in about 1 week (around 08/23/2022) for [facility] APP, any provider, In-Person.  Future Appointments  Date Time Provider [place]  08/24/2022  8:15 AM Manya Silvas, Mallory [facility]  08/31/2022 10:15 AM Aletha Halim, MD [facility]  09/06/2022  2:15 PM [facility] NST [facility]  09/06/2022  3:15 PM Aletha Halim, MD [facility]    Kerry Hough, PA-C

## 2022-08-20 ENCOUNTER — Other Ambulatory Visit (HOSPITAL_COMMUNITY): Payer: Self-pay | Admitting: Advanced Practice Midwife

## 2022-08-22 ENCOUNTER — Encounter (HOSPITAL_COMMUNITY): Payer: Self-pay | Admitting: *Deleted

## 2022-08-22 ENCOUNTER — Telehealth (HOSPITAL_COMMUNITY): Payer: Self-pay | Admitting: *Deleted

## 2022-08-22 ENCOUNTER — Other Ambulatory Visit: Payer: Self-pay | Admitting: Advanced Practice Midwife

## 2022-08-22 NOTE — Telephone Encounter (Signed)
Preadmission screen  

## 2022-08-23 NOTE — Progress Notes (Signed)
   PRENATAL VISIT NOTE  Subjective:  Julie [name] is a 32 y.o. [phone] at [redacted]w[redacted]d being seen today for ongoing prenatal care.  She is currently monitored for the following issues for this high-risk pregnancy and has Supervision of high risk pregnancy, antepartum; History of pre-eclampsia; Alpha thalassemia silent carrier; Gestational diabetes mellitus, antepartum; and GBS (group B Streptococcus carrier), +RV culture, currently pregnant on their problem list.  Patient reports contractions since last night .  Contractions: Irregular. Vag. Bleeding: None.  Movement: (!) Decreased. Denies leaking of fluid.   The following portions of the patient's history were reviewed and updated as appropriate: allergies, current medications, past family history, past medical history, past social history, past surgical history and problem list.   Objective:   Vitals:   08/24/22 0831  BP: 111/61  Pulse: (!) 118  Weight: 209 lb 6.4 oz (95 kg)    Fetal Status: Fetal Heart Rate (bpm): 130   Movement: (!) Decreased     General:  Alert, oriented and cooperative. Patient is in no acute distress.  Skin: Skin is warm and dry. No rash noted.   Cardiovascular: Normal heart rate noted  Respiratory: Normal respiratory effort, no problems with respiration noted  Abdomen: Soft, gravid, appropriate for gestational age.  Pain/Pressure: Present     Pelvic: Cervical exam deferred        Extremities: Normal range of motion.  Edema: None  Mental Status: Normal mood and affect. Normal behavior. Normal judgment and thought content.   Assessment and Plan:  Pregnancy: [id number] at [redacted]w[redacted]d 1. GBS (group B Streptococcus carrier), +RV culture, currently pregnant - PCN in labor  2. History of pre-eclampsia - Normotensive today  3. Supervision of high risk pregnancy, antepartum   4. Alpha thalassemia silent carrier - Partner kit given  5. Diet controlled gestational diabetes mellitus (GDM), antepartum -  Well-control w/out meds  6. [redacted] weeks gestation of pregnancy  Decreased FM, 38.4, A1GDM. NST initially non-reactive, audible decel in office, but improved. Per Dr. Elgie Congo, sending her to MAU for prolonged monitoring. MAU providers notified that pt is coming. Dr. Elgie Congo recommend pt eat on the way over since she has not eaten yet this morning.    Term labor symptoms and general obstetric precautions including but not limited to vaginal bleeding, contractions, leaking of fluid and fetal movement were reviewed in detail with the patient. Please refer to After Visit Summary for other counseling recommendations.   Return in about 6 weeks (around 10/05/2022) for Postpartum visit, GTT.  Future Appointments  Date Time Provider [place]  08/28/2022  6:30 AM [facility]-LD [place] None  09/24/2022  8:15 AM Aletha Halim, MD [facility]  09/24/2022  8:50 AM [facility] LAB [facility] [place], CNM

## 2022-08-24 ENCOUNTER — Encounter: Payer: Medicaid Other | Admitting: Advanced Practice Midwife

## 2022-08-24 ENCOUNTER — Other Ambulatory Visit: Payer: Self-pay

## 2022-08-24 ENCOUNTER — Other Ambulatory Visit: Payer: Self-pay | Admitting: Advanced Practice Midwife

## 2022-08-24 ENCOUNTER — Encounter (HOSPITAL_COMMUNITY): Payer: Self-pay | Admitting: Obstetrics and Gynecology

## 2022-08-24 ENCOUNTER — Ambulatory Visit (INDEPENDENT_AMBULATORY_CARE_PROVIDER_SITE_OTHER): Payer: Medicaid Other | Admitting: Advanced Practice Midwife

## 2022-08-24 ENCOUNTER — Inpatient Hospital Stay (HOSPITAL_COMMUNITY)
Admission: AD | Admit: 2022-08-24 | Discharge: 2022-08-24 | Disposition: A | Discharge status: Home / Self Care | Payer: Medicaid Other | Attending: Obstetrics and Gynecology | Admitting: Obstetrics and Gynecology

## 2022-08-24 VITALS — BP 111/61 | HR 118 | Wt 209.4 lb

## 2022-08-24 DIAGNOSIS — Z3A38 38 weeks gestation of pregnancy: Secondary | ICD-10-CM | POA: Insufficient documentation

## 2022-08-24 DIAGNOSIS — O99820 Streptococcus B carrier state complicating pregnancy: Secondary | ICD-10-CM

## 2022-08-24 DIAGNOSIS — O368130 Decreased fetal movements, third trimester, not applicable or unspecified: Secondary | ICD-10-CM | POA: Diagnosis not present

## 2022-08-24 DIAGNOSIS — O0990 Supervision of high risk pregnancy, unspecified, unspecified trimester: Secondary | ICD-10-CM

## 2022-08-24 DIAGNOSIS — Z8759 Personal history of other complications of pregnancy, childbirth and the puerperium: Secondary | ICD-10-CM

## 2022-08-24 DIAGNOSIS — Z3689 Encounter for other specified antenatal screening: Secondary | ICD-10-CM | POA: Diagnosis not present

## 2022-08-24 DIAGNOSIS — O0993 Supervision of high risk pregnancy, unspecified, third trimester: Secondary | ICD-10-CM

## 2022-08-24 DIAGNOSIS — D563 Thalassemia minor: Secondary | ICD-10-CM

## 2022-08-24 DIAGNOSIS — O24410 Gestational diabetes mellitus in pregnancy, diet controlled: Secondary | ICD-10-CM

## 2022-08-24 NOTE — MAU Note (Signed)
.  Julie [name] is a 32 y.o. at [redacted]w[redacted]d here in MAU reporting: x4 days she has had DFM. Has felt baby move a few times today but not as frequent. Denies VB or LOF. Is also feeling irregular ctx.  Onset of complaint: 08/20/22 Pain score: 8 Vitals:   08/24/22 1049  BP: 122/60  Pulse: (!) 120  Resp: 14  Temp: 99.1 F (37.3 C)  SpO2: 100%     FHT:150

## 2022-08-24 NOTE — MAU Provider Note (Signed)
History     CSN: [id number]  Arrival date and time: 08/24/22 1031   Event Date/Time   First Provider Initiated Contact with Patient 08/24/22 1131      Chief Complaint  Patient presents with   Decreased Fetal Movement   Julie [name] is a 32 y.o. [id number] at 58w[facility]  She presents today for DFM. She states she has noted DFM for the past 4 days.  She states fetus is still active, but not at same capacity.  She reports she notes improvement in movement when she is resting on her left side.  Patient reports irregular contractions, but denies vaginal concerns including bleeding and LOF.   OB History     Gravida  5   Para  3   Term  3   Preterm      AB  1   Living  3      SAB  1   IAB      Ectopic      Multiple  0   Live Births  3           Past Medical History:  Diagnosis Date   Anemia    Gestational diabetes    Pregnancy induced hypertension    Pregnancy induced hypertension 02/01/2022    Past Surgical History:  Procedure Laterality Date   NO PAST SURGERIES     WISDOM TOOTH EXTRACTION      Family History  Problem Relation Age of Onset   Diabetes Mother    Diabetes Paternal Aunt    Asthma Neg Hx    Cancer Neg Hx    Heart disease Neg Hx    Hypertension Neg Hx    Stroke Neg Hx     Social History   Tobacco Use   Smoking status: Never   Smokeless tobacco: Never  Vaping Use   Vaping Use: Never used  Substance Use Topics   Alcohol use: No   Drug use: No    Allergies: No Known Allergies  Medications Prior to Admission  Medication Sig Dispense Refill Last Dose   aspirin EC 81 MG tablet Take 81 mg by mouth daily. Swallow whole.   Past Week   Blood Glucose Monitoring Suppl (ACCU-CHEK GUIDE) w/Device KIT 1 Device by Does not apply route 4 (four) times daily. 1 kit 0 08/24/2022   glucose blood (ACCU-CHEK GUIDE) test strip Use to check blood sugars four times a day was instructed 50 each 12 08/24/2022    prenatal vitamin w/FE, FA (PRENATAL 1 + 1) 27-1 MG TABS tablet Take 1 tablet by mouth daily at 12 noon. 30 tablet 11 08/23/2022   Accu-Chek Softclix Lancets lancets Use as instructed QID 100 each 12     Review of Systems  Eyes:  Negative for visual disturbance.  Gastrointestinal:  Negative for abdominal pain, constipation, diarrhea, nausea and vomiting.  Genitourinary:  Negative for difficulty urinating, dysuria, vaginal bleeding and vaginal discharge.  Neurological:  Negative for dizziness, light-headedness and headaches.   Physical Exam   Blood pressure 117/67, pulse (!) 132, temperature 99.1 F (37.3 C), temperature source Oral, resp. rate 14, weight 94.8 kg, last menstrual period 11/27/2021, SpO2 99 %, unknown if currently breastfeeding.  Physical Exam Vitals reviewed.  Constitutional:      Appearance: Normal appearance.  HENT:     Head: Normocephalic and atraumatic.  Eyes:     Conjunctiva/sclera: Conjunctivae normal.  Cardiovascular:     Rate and Rhythm: Normal  rate.  Pulmonary:     Effort: Pulmonary effort is normal.  Abdominal:     Palpations: Abdomen is soft.     Tenderness: There is no abdominal tenderness.  Musculoskeletal:        General: Normal range of motion.     Cervical back: Normal range of motion.  Skin:    General: Skin is warm and dry.  Neurological:     Mental Status: She is alert and oriented to person, place, and time.  Psychiatric:        Mood and Affect: Mood normal.        Behavior: Behavior normal.     Fetal Assessment 125 bpm, Mod Var, -Decels, +Accels Toco: One ctx graphed  MAU Course  No results found for this or any previous visit (from the past 24 hour(s)). No results found.  MDM PE Labs: None EFM  Assessment and Plan  32 year old G5P3013  SIUP at 38.4 weeks Cat I FT DFM  -POC Reviewed. -NST reactive thus far. -Reassured that decreased fetal movement is common towards late third trimester. -Patient scheduled for IOL on  Tuesday. -Will monitor further and if no issues plan for discharge. -Patient agreeable.  Maryann Conners MSN, CNM 08/24/2022, 11:31 AM   Reassessment (11:50 AM) -NST remains reactive. -Discharged to home.   Maryann Conners MSN, CNM Advanced Practice Provider, [facility] [employer] [facility]

## 2022-08-26 ENCOUNTER — Other Ambulatory Visit: Payer: Self-pay | Admitting: Advanced Practice Midwife

## 2022-08-28 ENCOUNTER — Inpatient Hospital Stay (HOSPITAL_COMMUNITY): Payer: Medicaid Other | Admitting: Anesthesiology

## 2022-08-28 ENCOUNTER — Other Ambulatory Visit: Payer: Self-pay

## 2022-08-28 ENCOUNTER — Encounter (HOSPITAL_COMMUNITY): Payer: Self-pay | Admitting: Family Medicine

## 2022-08-28 ENCOUNTER — Inpatient Hospital Stay (HOSPITAL_COMMUNITY): Payer: Medicaid Other

## 2022-08-28 ENCOUNTER — Inpatient Hospital Stay (HOSPITAL_COMMUNITY)
Admission: RE | Admit: 2022-08-28 | Discharge: 2022-08-30 | DRG: 807 | Disposition: A | Discharge status: Home / Self Care | Payer: Medicaid Other | Attending: Family Medicine | Admitting: Family Medicine

## 2022-08-28 DIAGNOSIS — O99824 Streptococcus B carrier state complicating childbirth: Secondary | ICD-10-CM | POA: Diagnosis not present

## 2022-08-28 DIAGNOSIS — O99214 Obesity complicating childbirth: Secondary | ICD-10-CM | POA: Diagnosis not present

## 2022-08-28 DIAGNOSIS — D563 Thalassemia minor: Secondary | ICD-10-CM | POA: Diagnosis present

## 2022-08-28 DIAGNOSIS — O9902 Anemia complicating childbirth: Secondary | ICD-10-CM | POA: Diagnosis not present

## 2022-08-28 DIAGNOSIS — O24420 Gestational diabetes mellitus in childbirth, diet controlled: Principal | ICD-10-CM | POA: Diagnosis present

## 2022-08-28 DIAGNOSIS — Z7982 Long term (current) use of aspirin: Secondary | ICD-10-CM | POA: Diagnosis not present

## 2022-08-28 DIAGNOSIS — Z8759 Personal history of other complications of pregnancy, childbirth and the puerperium: Secondary | ICD-10-CM

## 2022-08-28 DIAGNOSIS — O24410 Gestational diabetes mellitus in pregnancy, diet controlled: Secondary | ICD-10-CM | POA: Diagnosis present

## 2022-08-28 DIAGNOSIS — O368130 Decreased fetal movements, third trimester, not applicable or unspecified: Secondary | ICD-10-CM | POA: Diagnosis present

## 2022-08-28 DIAGNOSIS — Z3A39 39 weeks gestation of pregnancy: Secondary | ICD-10-CM

## 2022-08-28 DIAGNOSIS — O99820 Streptococcus B carrier state complicating pregnancy: Secondary | ICD-10-CM

## 2022-08-28 DIAGNOSIS — O24429 Gestational diabetes mellitus in childbirth, unspecified control: Secondary | ICD-10-CM | POA: Diagnosis not present

## 2022-08-28 DIAGNOSIS — Z148 Genetic carrier of other disease: Secondary | ICD-10-CM | POA: Diagnosis not present

## 2022-08-28 DIAGNOSIS — O0990 Supervision of high risk pregnancy, unspecified, unspecified trimester: Secondary | ICD-10-CM

## 2022-08-28 DIAGNOSIS — D649 Anemia, unspecified: Secondary | ICD-10-CM | POA: Diagnosis not present

## 2022-08-28 HISTORY — DX: Gestational diabetes mellitus in pregnancy, diet controlled: O24.410

## 2022-08-28 LAB — TYPE AND SCREEN
ABO/RH(D): O POS
Antibody Screen: NEGATIVE

## 2022-08-28 LAB — RPR: RPR Ser Ql: NONREACTIVE

## 2022-08-28 LAB — CBC
HCT: 30 % — ABNORMAL LOW (ref 36.0–46.0)
Hemoglobin: 9.5 g/dL — ABNORMAL LOW (ref 12.0–15.0)
MCH: 23.5 pg — ABNORMAL LOW (ref 26.0–34.0)
MCHC: 31.7 g/dL (ref 30.0–36.0)
MCV: 74.1 fL — ABNORMAL LOW (ref 80.0–100.0)
Platelets: 235 10*3/uL (ref 150–400)
RBC: 4.05 MIL/uL (ref 3.87–5.11)
RDW: 15.7 % — ABNORMAL HIGH (ref 11.5–15.5)
WBC: 6.7 10*3/uL (ref 4.0–10.5)
nRBC: 0 % (ref 0.0–0.2)

## 2022-08-28 LAB — GLUCOSE, CAPILLARY
Glucose-Capillary: 81 mg/dL (ref 70–99)
Glucose-Capillary: 74 mg/dL (ref 70–99)

## 2022-08-28 MED ORDER — LIDOCAINE HCL (PF) 1 % IJ SOLN: 30.0000 mL | INTRAMUSCULAR | Status: DC | PRN

## 2022-08-28 MED ORDER — BENZOCAINE-MENTHOL 20-0.5 % EX AERO
1.0000 | INHALATION_SPRAY | CUTANEOUS | Status: DC | PRN
  Administered 2022-08-28: 1 via TOPICAL
  Filled 2022-08-28: qty 56

## 2022-08-28 MED ORDER — ACETAMINOPHEN 325 MG PO TABS
650.0000 mg | ORAL_TABLET | ORAL | Status: DC | PRN
  Administered 2022-08-28 – 2022-08-29 (×2): 650 mg via ORAL
  Filled 2022-08-28 (×2): qty 2

## 2022-08-28 MED ORDER — COCONUT OIL OIL: 1.0000 | TOPICAL_OIL | Status: DC | PRN

## 2022-08-28 MED ORDER — PRENATAL MULTIVITAMIN CH
1.0000 | ORAL_TABLET | Freq: Every day | ORAL | Status: DC
  Administered 2022-08-29: 1 via ORAL
  Filled 2022-08-28: qty 1

## 2022-08-28 MED ORDER — LIDOCAINE HCL (PF) 1 % IJ SOLN
INTRAMUSCULAR | Status: DC | PRN
  Administered 2022-08-28: 5 mL via EPIDURAL
  Administered 2022-08-28: 3 mL via EPIDURAL
  Administered 2022-08-28: 2 mL via EPIDURAL

## 2022-08-28 MED ORDER — SODIUM CHLORIDE 0.9 % IV SOLN
5.0000 10*6.[IU] | Freq: Once | INTRAVENOUS | Status: AC
  Administered 2022-08-28: 5 10*6.[IU] via INTRAVENOUS
  Filled 2022-08-28: qty 5

## 2022-08-28 MED ORDER — DIBUCAINE (PERIANAL) 1 % EX OINT: 1.0000 | TOPICAL_OINTMENT | CUTANEOUS | Status: DC | PRN

## 2022-08-28 MED ORDER — OXYCODONE-ACETAMINOPHEN 5-325 MG PO TABS
1.0000 | ORAL_TABLET | ORAL | Status: DC | PRN
  Administered 2022-08-28: 1 via ORAL
  Filled 2022-08-28: qty 1

## 2022-08-28 MED ORDER — SENNOSIDES-DOCUSATE SODIUM 8.6-50 MG PO TABS: 2.0000 | ORAL_TABLET | Freq: Every day | ORAL | Status: DC

## 2022-08-28 MED ORDER — TERBUTALINE SULFATE 1 MG/ML IJ SOLN: 0.2500 mg | Freq: Once | INTRAMUSCULAR | Status: DC | PRN

## 2022-08-28 MED ORDER — PHENYLEPHRINE 80 MCG/ML (10ML) SYRINGE FOR IV PUSH (FOR BLOOD PRESSURE SUPPORT): 80.0000 ug | PREFILLED_SYRINGE | INTRAVENOUS | Status: DC | PRN

## 2022-08-28 MED ORDER — LACTATED RINGERS IV SOLN
500.0000 mL | Freq: Once | INTRAVENOUS | Status: AC
  Administered 2022-08-28: 500 mL via INTRAVENOUS

## 2022-08-28 MED ORDER — PENICILLIN G POT IN DEXTROSE 60000 UNIT/ML IV SOLN
3.0000 10*6.[IU] | INTRAVENOUS | Status: DC
  Administered 2022-08-28: 3 10*6.[IU] via INTRAVENOUS
  Filled 2022-08-28: qty 50

## 2022-08-28 MED ORDER — OXYTOCIN-SODIUM CHLORIDE 30-0.9 UT/500ML-% IV SOLN
1.0000 m[IU]/min | INTRAVENOUS | Status: DC
  Administered 2022-08-28: 2 m[IU]/min via INTRAVENOUS
  Filled 2022-08-28: qty 500

## 2022-08-28 MED ORDER — LACTATED RINGERS IV SOLN: 500.0000 mL | INTRAVENOUS | Status: DC | PRN

## 2022-08-28 MED ORDER — DIPHENHYDRAMINE HCL 50 MG/ML IJ SOLN: 12.5000 mg | INTRAMUSCULAR | Status: DC | PRN

## 2022-08-28 MED ORDER — LACTATED RINGERS IV SOLN: INTRAVENOUS | Status: DC

## 2022-08-28 MED ORDER — IBUPROFEN 600 MG PO TABS
600.0000 mg | ORAL_TABLET | Freq: Four times a day (QID) | ORAL | Status: DC
  Administered 2022-08-28 – 2022-08-30 (×6): 600 mg via ORAL
  Filled 2022-08-28 (×6): qty 1

## 2022-08-28 MED ORDER — ONDANSETRON HCL 4 MG PO TABS: 4.0000 mg | ORAL_TABLET | ORAL | Status: DC | PRN

## 2022-08-28 MED ORDER — DIPHENHYDRAMINE HCL 25 MG PO CAPS: 25.0000 mg | ORAL_CAPSULE | Freq: Four times a day (QID) | ORAL | Status: DC | PRN

## 2022-08-28 MED ORDER — EPHEDRINE 5 MG/ML INJ: 10.0000 mg | INTRAVENOUS | Status: DC | PRN

## 2022-08-28 MED ORDER — ZOLPIDEM TARTRATE 5 MG PO TABS: 5.0000 mg | ORAL_TABLET | Freq: Every evening | ORAL | Status: DC | PRN

## 2022-08-28 MED ORDER — ONDANSETRON HCL 4 MG/2ML IJ SOLN: 4.0000 mg | INTRAMUSCULAR | Status: DC | PRN

## 2022-08-28 MED ORDER — ACETAMINOPHEN 325 MG PO TABS: 650.0000 mg | ORAL_TABLET | ORAL | Status: DC | PRN

## 2022-08-28 MED ORDER — WITCH HAZEL-GLYCERIN EX PADS: 1.0000 | MEDICATED_PAD | CUTANEOUS | Status: DC | PRN

## 2022-08-28 MED ORDER — OXYTOCIN BOLUS FROM INFUSION
333.0000 mL | Freq: Once | INTRAVENOUS | Status: AC
  Administered 2022-08-28: 333 mL via INTRAVENOUS

## 2022-08-28 MED ORDER — FENTANYL-BUPIVACAINE-NACL 0.5-0.125-0.9 MG/250ML-% EP SOLN
12.0000 mL/h | EPIDURAL | Status: DC | PRN
  Administered 2022-08-28: 12 mL/h via EPIDURAL
  Filled 2022-08-28: qty 250

## 2022-08-28 MED ORDER — LACTATED RINGERS IV SOLN: 500.0000 mL | Freq: Once | INTRAVENOUS | Status: DC

## 2022-08-28 MED ORDER — SOD CITRATE-CITRIC ACID 500-334 MG/5ML PO SOLN: 30.0000 mL | ORAL | Status: DC | PRN

## 2022-08-28 MED ORDER — ONDANSETRON HCL 4 MG/2ML IJ SOLN: 4.0000 mg | Freq: Four times a day (QID) | INTRAMUSCULAR | Status: DC | PRN

## 2022-08-28 MED ORDER — TETANUS-DIPHTH-ACELL PERTUSSIS 5-2.5-18.5 LF-MCG/0.5 IM SUSY: 0.5000 mL | PREFILLED_SYRINGE | Freq: Once | INTRAMUSCULAR | Status: DC

## 2022-08-28 MED ORDER — SIMETHICONE 80 MG PO CHEW: 80.0000 mg | CHEWABLE_TABLET | ORAL | Status: DC | PRN

## 2022-08-28 MED ORDER — OXYTOCIN-SODIUM CHLORIDE 30-0.9 UT/500ML-% IV SOLN
2.5000 [IU]/h | INTRAVENOUS | Status: DC
  Administered 2022-08-28: 2.5 [IU]/h via INTRAVENOUS

## 2022-08-28 MED ORDER — OXYCODONE-ACETAMINOPHEN 5-325 MG PO TABS: 2.0000 | ORAL_TABLET | ORAL | Status: DC | PRN

## 2022-08-28 NOTE — Progress Notes (Signed)
Labor Progress Note  Julie [name] is a 32 y.o. [phone] at 49w1dpresented for IOL for GDMA1  S: Comfortable with epidural in place  O:  BP 127/77   Pulse 82   Temp 98.3 F (36.8 C) (Oral)   Resp 16   Ht 5' 2"$  (1.575 m)   Wt 96.6 kg   LMP 11/27/2021 Comment: SAB  SpO2 99%   BMI 38.96 kg/m  EFM: 130bpm/Moderate variability/ 15x15 accels/ None decels  CVE: Dilation: 6 Effacement (%): 80 Station: -2 Presentation: Vertex Exam by:: HJerilee Hoh DO   A&P: 32y.o. GHW:[id number] for IOL as above  #Labor: Progressing well. AROM with slight mec. Expectant management of SVD. #Pain: Epidural #FWB: CAT 1 #GBS positive - on PCN #GDMA1: CBG q4h. Well controlled and continue to monitor.   KaColletta MarylandMD 08/28/22 12:54 PM

## 2022-08-28 NOTE — Discharge Summary (Signed)
Postpartum Discharge Summary  Date of Service updated***     Patient Name: Julie [name] DOB: 1991/02/08 MRN: [id number]  Date of admission: 08/28/2022 Delivery date:08/28/2022  Delivering provider: Shelda Pal  Date of discharge: 08/28/2022  Admitting diagnosis: GDM, class A1 [O24.410] Intrauterine pregnancy: [redacted]w[redacted]d    Secondary diagnosis:  Principal Problem:   Vaginal delivery Active Problems:   Supervision of high risk pregnancy, antepartum   History of pre-eclampsia   Alpha thalassemia silent carrier   GBS (group B Streptococcus carrier), +RV culture, currently pregnant   GDM, class A1  Additional problems: ***    Discharge diagnosis: Term Pregnancy Delivered and GDM A1                                              Post partum procedures:{Postpartum procedures:23558} Augmentation: AROM and Pitocin Complications: None  Hospital course: Induction of Labor With Vaginal Delivery   32y.o. yo [phone] admitted to the hospital 08/28/2022 for induction of labor.  Indication for induction: A1 DM.  Patient had an labor course complicated by none. Membrane Rupture Time/Date: 12:50 PM ,08/28/2022   Delivery Method:Vaginal, Spontaneous  Episiotomy: None  Lacerations:  None  Details of delivery can be found in separate delivery note.  Patient had a postpartum course complicated by***. Patient is discharged home 08/28/22.  Newborn Data: Birth date:08/28/2022  Birth time:2:34 PM  Gender:Female  Living status:Living  Apgars:9 ,9  Weight:   Magnesium Sulfate received: {Mag received:30440022} BMZ received: No Rhophylac:N/A MMR:N/A T-DaP:Given prenatally Flu: Yes Transfusion:{Transfusion received:30440034}  Physical exam  Vitals:   08/28/22 1301 08/28/22 1335 08/28/22 1445 08/28/22 1502  BP: 121/71 118/66 126/65 (!) 92/58  Pulse: 89 86 80 (!) 101  Resp: 16 16    Temp:      TempSrc:      SpO2:      Weight:      Height:       General:  {Exam; general:21111117} Lochia: {Desc; appropriate/inappropriate:30686::"appropriate"} Uterine Fundus: {Desc; firm/soft:30687} Incision: {Exam; incision:21111123} DVT Evaluation: {Exam; dvt:2111122} Labs: Lab Results  Component Value Date   WBC 6.7 08/28/2022   HGB 9.5 (L) 08/28/2022   HCT 30.0 (L) 08/28/2022   MCV 74.1 (L) 08/28/2022   PLT 235 08/28/2022      Latest Ref Rng & Units 02/22/2022    9:45 AM  CMP  Glucose 70 - 99 mg/dL 83   BUN 6 - 20 mg/dL 8   Creatinine 0.57 - 1.00 mg/dL 0.53   Sodium 134 - 144 mmol/L 136   Potassium 3.5 - 5.2 mmol/L 3.9   Chloride 96 - 106 mmol/L 100   CO2 20 - 29 mmol/L 20   Calcium 8.7 - 10.2 mg/dL 9.7   Total Protein 6.0 - 8.5 g/dL 7.6   Total Bilirubin 0.0 - 1.2 mg/dL 0.4   Alkaline Phos 44 - 121 IU/L 42   AST 0 - 40 IU/L 12   ALT 0 - 32 IU/L 11    Edinburgh Score:    02/01/2020   10:45 PM  Edinburgh Postnatal Depression Scale Screening Tool  I have been able to laugh and see the funny side of things. 0  I have looked forward with enjoyment to things. 0  I have blamed myself unnecessarily when things went wrong. 1  I have been anxious or worried for no  good reason. 0  I have felt scared or panicky for no good reason. 0  Things have been getting on top of me. 0  I have been so unhappy that I have had difficulty sleeping. 0  I have felt sad or miserable. 1  I have been so unhappy that I have been crying. 0  The thought of harming myself has occurred to me. 0  Edinburgh Postnatal Depression Scale Total 2     After visit meds:  Allergies as of 08/28/2022   No Known Allergies   Med Rec must be completed prior to using this [facility]***        Discharge home in stable condition Infant Feeding: {Baby feeding:23562} Infant Disposition:{CHL IP OB HOME WITH DX:[id number] Discharge instruction: per After Visit Summary and Postpartum booklet. Activity: Advance as tolerated. Pelvic rest for 6 weeks.  Diet: {OB  BY:[id number] Future Appointments: Future Appointments  Date Time Provider Department Center  09/24/2022  8:15 AM Aletha Halim, MD [facility]  09/24/2022  8:50 AM [facility]   Follow up Visit: Message sent to [facility] 2/13  Please schedule this patient for a In person postpartum visit in 6 weeks with the following provider: Any provider. Additional Postpartum F/U:2 hour GTT and BP check 1 week  High risk pregnancy complicated by: GDM Delivery mode:  Vaginal, Spontaneous  Anticipated Birth Control:  Depo   08/28/2022 Shelda Pal, DO

## 2022-08-28 NOTE — Lactation Note (Signed)
This note was copied from a baby's chart. Lactation Consultation Note  Patient Name: Girl Felicidad Hogeland [id number] Date: 08/28/2022 Age : > 60 mins  Reason for consult: L&D Initial assessment;Term;Maternal endocrine disorder (When [facility] arrived baby on the warmer and getting a blood sugar, mom in the W/C ready to be transported to [facility]. per Mom the baby fed , Latch score 9) Mom aware she will be seen on M[facility] 5 th floor   Maternal Data Does the patient have breastfeeding experience prior to this delivery?: Yes How long did the patient breastfeed?: per mom BF 3 others 1 -1 /1/2 years .  Feeding Mother's Current Feeding Choice: Breast Milk and Formula  LATCH Score ( Latch by the LD RN )  Latch: Grasps breast easily, tongue down, lips flanged, rhythmical sucking.  Audible Swallowing: A few with stimulation  Type of Nipple: Everted at rest and after stimulation  Comfort (Breast/Nipple): Soft / non-tender  Hold (Positioning): No assistance needed to correctly position infant at breast.  LATCH Score: 9   Lactation Tools Discussed/Used    Interventions    Discharge    Consult Status Consult Status: Follow-up from L&D Date: 08/28/22 Follow-up type: In-patient    [place] 08/28/2022, 4:31 PM

## 2022-08-28 NOTE — Anesthesia Procedure Notes (Signed)
Epidural Patient location during procedure: OB Start time: 08/28/2022 11:48 AM End time: 08/28/2022 11:55 AM  Staffing Anesthesiologist: Santa Lighter, MD Performed: anesthesiologist   Preanesthetic Checklist Completed: patient identified, IV checked, risks and benefits discussed, monitors and equipment checked, pre-op evaluation and timeout performed  Epidural Patient position: sitting Prep: DuraPrep Patient monitoring: blood pressure and continuous pulse ox Approach: midline Location: L3-L4 Injection technique: LOR air  Needle:  Needle type: Tuohy  Needle gauge: 17 G Needle length: 9 cm Needle insertion depth: 6 cm Catheter size: 19 Gauge Catheter at skin depth: 11 cm Test dose: negative and Other (1% Lidocaine)  Additional Notes Patient identified.  Risk benefits discussed including failed block, incomplete pain control, headache, nerve damage, paralysis, blood pressure changes, nausea, vomiting, reactions to medication both toxic or allergic, and postpartum back pain.  Patient expressed understanding and wished to proceed.  All questions were answered.  Sterile technique used throughout procedure and epidural site dressed with sterile barrier dressing. No paresthesia or other complications noted. The patient did not experience any signs of intravascular injection such as tinnitus or metallic taste in mouth nor signs of intrathecal spread such as rapid motor block. Please see nursing notes for vital signs. Reason for block:procedure for pain

## 2022-08-28 NOTE — H&P (Addendum)
LABOR AND DELIVERY ADMISSION HISTORY AND PHYSICAL NOTE  Julie [name] is a 32 y.o. female [phone] with IUP at 32w1dby early [place]resenting for induction of labor secondary to A1GDM and recurrent decreased fetal movement.   She reports positive fetal movement, occasional contractions. She denies leakage of fluid, or vaginal bleeding.  She plans on breast feeding . Her contraception plan is: Depo.  [place] History/Com[place]  w 3d, CWD, normal anatomy, vertex presentation, posterior placenta, 78%ile, EFW 7lb 1oz  Pregnancy complications:  - GBS positive status  - A1GDM  - Alpha Thalassemia silent carrier - Hx of PreE with previous pregnancy   Past Medical History: Past Medical History:  Diagnosis Date   Anemia    Gestational diabetes    Pregnancy induced hypertension    Pregnancy induced hypertension 02/01/2022    Past Surgical History: Past Surgical History:  Procedure Laterality Date   NO PAST SURGERIES     WISDOM TOOTH EXTRACTION      Obstetrical History: OB History     Gravida  5   Para  3   Term  3   Preterm      AB  1   Living  3      SAB  1   IAB      Ectopic      Multiple  0   Live Births  3           Social History: Social History   Socioeconomic History   Marital status: Married    Spouse name: Not on file   Number of children: Not on file   Years of education: Not on file   Highest education level: Not on file  Occupational History   Not on file  Tobacco Use   Smoking status: Never   Smokeless tobacco: Never  Vaping Use   Vaping Use: Never used  Substance and Sexual Activity   Alcohol use: No   Drug use: No   Sexual activity: Yes    Birth control/protection: None  Other Topics Concern   Not on file  Social History Narrative   Not on file   Social Determinants of Health   Financial Resource Strain: Not on file  Food Insecurity: Not on file  Transportation Needs: Not on file   Physical Activity: Not on file  Stress: Not on file  Social Connections: Not on file    Family History: Family History  Problem Relation Age of Onset   Diabetes Mother    Diabetes Paternal Aunt    Asthma Neg Hx    Cancer Neg Hx    Heart disease Neg Hx    Hypertension Neg Hx    Stroke Neg Hx     Allergies: No Known Allergies  Medications Prior to Admission  Medication Sig Dispense Refill Last Dose   Accu-Chek Softclix Lancets lancets Use as instructed QID 100 each 12    aspirin EC 81 MG tablet Take 81 mg by mouth daily. Swallow whole.      Blood Glucose Monitoring Suppl (ACCU-CHEK GUIDE) w/Device KIT 1 Device by Does not apply route 4 (four) times daily. 1 kit 0    glucose blood (ACCU-CHEK GUIDE) test strip Use to check blood sugars four times a day was instructed 50 each 12    prenatal vitamin w/FE, FA (PRENATAL 1 + 1) 27-1 MG TABS tablet Take 1 tablet by mouth daily at 12 noon. 30 tablet 11  Review of Systems  All systems reviewed and negative except as stated in HPI  Physical Exam Last menstrual period 11/27/2021, unknown if currently breastfeeding. General appearance: alert, oriented, NAD Lungs: normal respiratory effort Heart: regular rate Abdomen: soft, non-tender; gravid, leopolds  Extremities: No calf swelling or tenderness Presentation: cephalic by SVE  Fetal monitoring Baseline: 130 bpm, Variability: Fair (1-6 bpm), and Accelerations: +15x15 Uterine activity: Occasional      Prenatal labs: ABO, Rh: O/Positive/-- (08/10 0945) Antibody: Negative (11/29 0822) Rubella: 17.70 (08/10 0945) RPR: Non Reactive (11/29 0822)  HBsAg: Negative (08/10 0945)  HIV: Non Reactive (11/29 [id number])  GC/Chlamydia: Negative  GBS: Positive/-- (01/25 1323)  2-hr GTT: abnormal, dx A1GDM Genetic screening:  Natera low risk  Anatomy US: Normal XX anatomy   Prenatal Transfer Tool  Maternal Diabetes: Yes:  Diabetes Type:  Diet controlled Genetic Screening: Normal Maternal  Ultrasounds/Referrals: Normal Fetal Ultrasounds or other Referrals:  None Maternal Substance Abuse:  No Significant Maternal Medications:  None Significant Maternal Lab Results: Group B Strep positive  No results found for this or any previous visit (from the past 24 hour(s)).  Patient Active Problem List   Diagnosis Date Noted   GBS (group B Streptococcus carrier), +RV culture, currently pregnant 08/14/2022   Gestational diabetes mellitus, antepartum 06/14/2022   Alpha thalassemia silent carrier 05/01/2022   Supervision of high risk pregnancy, antepartum 02/01/2022   History of pre-eclampsia 02/01/2022    Assessment: Julie [name] is a 32 y.o. [id number] at 73w1dhere for induction of labor secondary to A1GDM and recurrent decreased fetal movement.  #Labor: Induction with pitocin 2x2. Consider AROM when adequate GBS prophylaxis received and head well applied to cervix.  #Pain: IV pain meds PRN, epidural upon request. #FWB: Cat1 #GBS/ID: Positive, prophylaxis with penicillin.  #MOF: breast #MOC: Depo    SConan Bowens SNM 08/28/2022, 6:02 AM   Attestation of Supervision of Student:  I confirm that I have verified the information documented in the nurse midwife student's note and that I have also personally  coordinated and was present for  the history, physical exam and all medical decision making activities.  I have verified that all services and findings are accurately documented in this student's note; and I agree with management and plan as outlined in the documentation. I have also made any necessary editorial changes.  --Patient has not eaten today --4cm on admission, occasional contractions --Will initiate Pitocin 2 x 2 after she has eaten light labor breakfast --Anticipate vaginal birth  SDarlina Rumpf CBel[place]for [employer] [facility] 08/28/2022 7:24 AM

## 2022-08-28 NOTE — Anesthesia Preprocedure Evaluation (Signed)
Anesthesia Evaluation  Patient identified by MRN, date of birth, ID band Patient awake    Reviewed: Allergy & Precautions, NPO status , Patient's Chart, lab work & pertinent test results  Airway Mallampati: II  TM Distance: >3 FB Neck ROM: Full    Dental  (+) Teeth Intact, Dental Advisory Given   Pulmonary neg pulmonary ROS   Pulmonary exam normal breath sounds clear to auscultation       Cardiovascular hypertension, Normal cardiovascular exam Rhythm:Regular Rate:Normal     Neuro/Psych negative neurological ROS     GI/Hepatic negative GI ROS, Neg liver ROS,,,  Endo/Other  diabetes, Well Controlled, Gestational  Obesity   Renal/GU negative Renal ROS     Musculoskeletal negative musculoskeletal ROS (+)    Abdominal   Peds  Hematology  (+) Blood dyscrasia, anemia Plt 235k   Anesthesia Other Findings Day of surgery medications reviewed with the patient.  Reproductive/Obstetrics (+) Pregnancy                             Anesthesia Physical Anesthesia Plan  ASA: 3  Anesthesia Plan: Epidural   Post-op Pain Management:    Induction:   PONV Risk Score and Plan: 2 and Treatment may vary due to age or medical condition  Airway Management Planned: Natural Airway  Additional Equipment:   Intra-op Plan:   Post-operative Plan:   Informed Consent: I have reviewed the patients History and Physical, chart, labs and discussed the procedure including the risks, benefits and alternatives for the proposed anesthesia with the patient or authorized representative who has indicated his/her understanding and acceptance.     Dental advisory given  Plan Discussed with:   Anesthesia Plan Comments: (Patient identified. Risks/Benefits/Options discussed with patient including but not limited to bleeding, infection, nerve damage, paralysis, failed block, incomplete pain control, headache, blood  pressure changes, nausea, vomiting, reactions to medication both or allergic, itching and postpartum back pain. Confirmed with bedside nurse the patient's most recent platelet count. Confirmed with patient that they are not currently taking any anticoagulation, have any bleeding history or any family history of bleeding disorders. Patient expressed understanding and wished to proceed. All questions were answered. )       Anesthesia Quick Evaluation

## 2022-08-28 NOTE — Progress Notes (Signed)
Labor Progress Note  Julie [name] is a 32 y.o. [phone] at 38w1dpresented for IOL for GDMA1  S: Feeling more uncomfortable, ready for her epidural.  O:  BP 104/64   Pulse 90   Temp 98.2 F (36.8 C) (Oral)   Resp 16   Ht 5' 2"$  (1.575 m)   Wt 96.6 kg   LMP 11/27/2021 Comment: SAB  BMI 38.96 kg/m  EFM: 125bpm/Moderate variability/ 15x15 accels/ None decels  CVE: Dilation: 5.5 Effacement (%): 80 Station: -2 Presentation: Vertex Exam by:: Otten, RN   A&P: 32y.o. GHW:[id number] for IOL as above  #Labor: Progressing well. Continue titration of pitocin to contraction adequacy. Plan for AROM after comfortable with epidural. #Pain: Planning for epidural #FWB: CAT 1 #GBS positive - on PCN #GDMA1: CBG q4h. Well controlled and continue to monitor.  KaColletta MarylandMD 08/28/22 11:18 AM

## 2022-08-29 LAB — GLUCOSE, CAPILLARY
Glucose-Capillary: 76 mg/dL (ref 70–99)
Glucose-Capillary: 79 mg/dL (ref 70–99)

## 2022-08-29 MED ORDER — ACETAMINOPHEN 325 MG PO TABS: 650.0000 mg | ORAL_TABLET | ORAL | 0 refills | Status: AC | PRN

## 2022-08-29 MED ORDER — IBUPROFEN 600 MG PO TABS: 600.0000 mg | ORAL_TABLET | Freq: Four times a day (QID) | ORAL | 0 refills | Status: DC

## 2022-08-29 NOTE — Anesthesia Postprocedure Evaluation (Signed)
Anesthesia Post Note  Patient: Manju Mohammed Dan Awudu  Procedure(s) Performed: AN [facility] [place]     Patient location during evaluation: Mother Baby Anesthesia Type: Epidural Level of consciousness: awake and alert and oriented Pain management: satisfactory to patient Vital Signs Assessment: post-procedure vital signs reviewed and stable Respiratory status: respiratory function stable Cardiovascular status: stable Postop Assessment: no headache, no backache, epidural receding, patient able to bend at knees, no signs of nausea or vomiting, adequate PO intake and able to ambulate Anesthetic complications: no   No notable events documented.  Last Vitals:  Vitals:   08/28/22 2345 08/29/22 0500  BP: (!) 99/56 124/78  Pulse: 72 88  Resp: 16 18  Temp: 36.9 C 36.8 C  SpO2: 100% 100%    Last Pain:  Vitals:   08/29/22 0920  TempSrc:   PainSc: 0-No pain   Pain Goal:                   Lahela Woodin

## 2022-08-29 NOTE — Lactation Note (Signed)
This note was copied from a baby's chart. Lactation Consultation Note  Patient Name: Julie [name] [id number] Date: 08/29/2022   Age:31 hours Birth Parent and infant is asleep at this time, LC will attempt to see family before shift ends if family still asleep, [facility] services will follow up with family in morning.  Maternal Data    Feeding    LATCH Score                    Lactation Tools Discussed/Used    Interventions    Discharge    Consult Status      [name] 08/29/2022, 1:14 AM

## 2022-08-29 NOTE — Lactation Note (Signed)
This note was copied from a baby's chart. Lactation Consultation Note  Patient Name: Julie [name] [id number] Date: 08/29/2022 Reason for consult: Initial assessment Age:31 hours  P4, Mother is experienced with breastfeeding.  Mother has baby latched when [facility] was in room in side lying.  Discussed milk coming to volume and use of hand pump.   Feed baby 8-12 times per day.  Suggest calling for help as needed.    Maternal Data Has patient been taught Hand Expression?: Yes Does the patient have breastfeeding experience prior to this delivery?: Yes How long did the patient breastfeed?: 1-1.5 years  Feeding Mother's Current Feeding Choice: Breast Milk  LATCH Score Latch: Grasps breast easily, tongue down, lips flanged, rhythmical sucking.  Audible Swallowing: A few with stimulation  Type of Nipple: Everted at rest and after stimulation  Comfort (Breast/Nipple): Soft / non-tender  Hold (Positioning): No assistance needed to correctly position infant at breast.  LATCH Score: 9   Lactation Tools Discussed/Used Tools: Pump Breast pump type: Manual  Interventions Interventions: Breast feeding basics reviewed;Hand pump;Education  Discharge    Consult Status Consult Status: Follow-up Date: 08/30/22 Follow-up type: In-patient    Julie [name] [facility] 08/29/2022, 9:12 AM

## 2022-08-30 MED ORDER — SENNOSIDES-DOCUSATE SODIUM 8.6-50 MG PO TABS: 2.0000 | ORAL_TABLET | Freq: Every day | ORAL | 0 refills | Status: DC

## 2022-08-30 MED ORDER — BENZOCAINE-MENTHOL 20-0.5 % EX AERO: 1.0000 | INHALATION_SPRAY | CUTANEOUS | 0 refills | Status: DC | PRN

## 2022-08-30 NOTE — Lactation Note (Signed)
This note was copied from a baby's chart. Lactation Consultation Note  Patient Name: Julie [name] Date: 08/30/2022 Reason for consult: Follow-up assessment Age:31 hours  P4, Experienced breastfeeding mother denies questions or concerns.  Feed on demand with cues.  Goal 8-12+ times per day after first 24 hrs.  Place baby STS if not cueing.  Reviewed engorgement care and monitoring voids/stools.  Feeding Mother's Current Feeding Choice: Breast Milk  Interventions Interventions: Education  Discharge Discharge Education: Engorgement and breast care;Warning signs for feeding baby  Consult Status Consult Status: Complete Date: 08/30/22    Vivianne Master [facility] Julie [name] 08/30/2022, 10:04 AM

## 2022-08-30 NOTE — Discharge Summary (Signed)
Postpartum Discharge Summary     Patient Name: Julie [name] DOB: 11-Sep-1990 MRN: [id number]  Date of admission: 08/28/2022 Delivery date:08/28/2022  Delivering provider: Shelda Pal  Date of discharge: 08/30/2022  Admitting diagnosis: GDM, class A1 [O24.410] Intrauterine pregnancy: [redacted]w[redacted]d    Secondary diagnosis:  Principal Problem:   Vaginal delivery Active Problems:   Supervision of high risk pregnancy, antepartum   History of pre-eclampsia   Alpha thalassemia silent carrier   GBS (group B Streptococcus carrier), +RV culture, currently pregnant   GDM, class A1  Additional problems: n/a    Discharge diagnosis: Term Pregnancy Delivered and GDM A1                                              Post partum procedures: n/a Augmentation: AROM and Pitocin Complications: None  Hospital course: Induction of Labor With Vaginal Delivery   32y.o. yo [id number] admitted to the hospital 08/28/2022 for induction of labor.  Indication for induction: A1 DM.  Patient had an labor course complicated by none. Membrane Rupture Time/Date: 12:50 PM ,08/28/2022   Delivery Method:Vaginal, Spontaneous  Episiotomy: None  Lacerations:  None  Details of delivery can be found in separate delivery note.  Patient had a postpartum course was uncomplicated, but pt stayed another day 2/2 baby. Patient is discharged home 08/30/22.  Newborn Data: Birth date:08/28/2022  Birth time:2:34 PM  Gender:Female  Living status:Living  Apgars:9 ,9  Weight:3690 g   Magnesium Sulfate received: No BMZ received: No Rhophylac:N/A MMR:N/A T-DaP:Given prenatally Flu: Yes Transfusion:No  Physical exam  Vitals:   08/28/22 2130 08/28/22 2345 08/29/22 0500 08/30/22 0510  BP: 124/63 (!) 99/56 124/78 132/81  Pulse: 77 72 88 93  Resp: 16 16 18 16  $ Temp: 98.5 F (36.9 C) 98.5 F (36.9 C) 98.3 F (36.8 C) 98.5 F (36.9 C)  TempSrc: Oral Oral Axillary Oral  SpO2: 99% 100% 100% 100%   Weight:      Height:       General: alert, cooperative, and no distress Lochia: appropriate Uterine Fundus: firm Incision: N/A DVT Evaluation: No evidence of DVT seen on physical exam. Labs: Lab Results  Component Value Date   WBC 6.7 08/28/2022   HGB 9.5 (L) 08/28/2022   HCT 30.0 (L) 08/28/2022   MCV 74.1 (L) 08/28/2022   PLT 235 08/28/2022      Latest Ref Rng & Units 02/22/2022    9:45 AM  CMP  Glucose 70 - 99 mg/dL 83   BUN 6 - 20 mg/dL 8   Creatinine 0.57 - 1.00 mg/dL 0.53   Sodium 134 - 144 mmol/L 136   Potassium 3.5 - 5.2 mmol/L 3.9   Chloride 96 - 106 mmol/L 100   CO2 20 - 29 mmol/L 20   Calcium 8.7 - 10.2 mg/dL 9.7   Total Protein 6.0 - 8.5 g/dL 7.6   Total Bilirubin 0.0 - 1.2 mg/dL 0.4   Alkaline Phos 44 - 121 IU/L 42   AST 0 - 40 IU/L 12   ALT 0 - 32 IU/L 11    Edinburgh Score:    08/28/2022    4:35 PM  Edinburgh Postnatal Depression Scale Screening Tool  I have been able to laugh and see the funny side of things. 0  I have looked forward with enjoyment to things. 1  I have blamed myself unnecessarily when things went wrong. 1  I have been anxious or worried for no good reason. 0  I have felt scared or panicky for no good reason. 0  Things have been getting on top of me. 0  I have been so unhappy that I have had difficulty sleeping. 0  I have felt sad or miserable. 0  I have been so unhappy that I have been crying. 0  The thought of harming myself has occurred to me. 0  Edinburgh Postnatal Depression Scale Total 2     After visit meds:  Allergies as of 08/30/2022   No Known Allergies      Medication List     STOP taking these medications    Accu-Chek Guide test strip Generic drug: glucose blood   Accu-Chek Guide w/Device Kit   Accu-Chek Softclix Lancets lancets   aspirin EC 81 MG tablet       TAKE these medications    acetaminophen 325 MG tablet Commonly known as: Tylenol Take 2 tablets (650 mg total) by mouth every 4 (four)  hours as needed (for pain scale < 4).   benzocaine-Menthol 20-0.5 % Aero Commonly known as: DERMOPLAST Apply 1 Application topically as needed for irritation (perineal discomfort).   ibuprofen 600 MG tablet Commonly known as: ADVIL Take 1 tablet (600 mg total) by mouth every 6 (six) hours.   prenatal vitamin w/FE, FA 27-1 MG Tabs tablet Take 1 tablet by mouth daily at 12 noon.   senna-docusate 8.6-50 MG tablet Commonly known as: Senokot-S Take 2 tablets by mouth daily.         Discharge home in stable condition Infant Feeding: Breast Infant Disposition:home with mother Discharge instruction: per After Visit Summary and Postpartum booklet. Activity: Advance as tolerated. Pelvic rest for 6 weeks.  Diet: routine diet Future Appointments: Future Appointments  Date Time Provider [place]  09/04/2022  2:00 PM [facility] NURSE [facility]  09/24/2022  8:15 AM Aletha Halim, MD [facility]  09/24/2022  8:50 AM [facility]   Follow up Visit: Message sent to [facility] 2/13  Please schedule this patient for a In person postpartum visit in 6 weeks with the following provider: Any provider. Additional Postpartum F/U:2 hour GTT  High risk pregnancy complicated by: GDM Delivery mode:  Vaginal, Spontaneous  Anticipated Birth Control:  Depo at [facility] visit   08/30/2022 Shelda Pal, DO

## 2022-08-31 ENCOUNTER — Encounter: Payer: Self-pay | Admitting: Obstetrics and Gynecology

## 2022-09-04 ENCOUNTER — Ambulatory Visit: Payer: Medicaid Other

## 2022-09-06 ENCOUNTER — Other Ambulatory Visit: Payer: Self-pay

## 2022-09-06 ENCOUNTER — Encounter: Payer: Self-pay | Admitting: Obstetrics and Gynecology

## 2022-09-08 ENCOUNTER — Telehealth (HOSPITAL_COMMUNITY): Payer: Self-pay

## 2022-09-08 NOTE — Telephone Encounter (Signed)
Patient did not answer phone call. Voicemail left for patient.   [facility] [place]  09/08/22,1123

## 2022-09-11 DIAGNOSIS — Z3483 Encounter for supervision of other normal pregnancy, third trimester: Secondary | ICD-10-CM | POA: Diagnosis not present

## 2022-09-11 DIAGNOSIS — Z3482 Encounter for supervision of other normal pregnancy, second trimester: Secondary | ICD-10-CM | POA: Diagnosis not present

## 2022-09-12 ENCOUNTER — Telehealth: Payer: Self-pay | Admitting: Family Medicine

## 2022-09-12 NOTE — Telephone Encounter (Signed)
Family connect nurse call patient blood pressure 120/100 right arm 118/92 left arm and has a hemorrhoids that bother her with pain 1-10 she is at an 8.

## 2022-09-13 ENCOUNTER — Encounter: Payer: Self-pay | Admitting: *Deleted

## 2022-09-13 NOTE — Telephone Encounter (Signed)
I called patient and left a message I am calling to follow up from nurse visit you had at home yesterday re: your blood pressure. We would like you to recheck your blood pressure and call us and leave message. If you are having a severe headache or large amount of edema you need to go to the hospital immediately for evaluation. Please call us tomorrow. I also send MyChart message. Staci Acosta

## 2022-09-14 DIAGNOSIS — Z419 Encounter for procedure for purposes other than remedying health state, unspecified: Secondary | ICD-10-CM | POA: Diagnosis not present

## 2022-09-24 ENCOUNTER — Other Ambulatory Visit: Payer: Self-pay

## 2022-09-24 ENCOUNTER — Other Ambulatory Visit: Payer: Medicaid Other

## 2022-09-24 ENCOUNTER — Encounter: Payer: Self-pay | Admitting: Obstetrics and Gynecology

## 2022-09-24 ENCOUNTER — Ambulatory Visit (INDEPENDENT_AMBULATORY_CARE_PROVIDER_SITE_OTHER): Payer: Medicaid Other | Admitting: Obstetrics and Gynecology

## 2022-09-24 ENCOUNTER — Other Ambulatory Visit: Payer: Self-pay | Admitting: General Practice

## 2022-09-24 VITALS — BP 124/87 | HR 83 | Wt 196.0 lb

## 2022-09-24 DIAGNOSIS — Z8632 Personal history of gestational diabetes: Secondary | ICD-10-CM

## 2022-09-24 DIAGNOSIS — R030 Elevated blood-pressure reading, without diagnosis of hypertension: Secondary | ICD-10-CM | POA: Diagnosis not present

## 2022-09-24 DIAGNOSIS — O24410 Gestational diabetes mellitus in pregnancy, diet controlled: Secondary | ICD-10-CM

## 2022-09-24 DIAGNOSIS — Z3042 Encounter for surveillance of injectable contraceptive: Secondary | ICD-10-CM | POA: Diagnosis not present

## 2022-09-24 MED ORDER — MEDROXYPROGESTERONE ACETATE 150 MG/ML IM SUSY
150.0000 mg | PREFILLED_SYRINGE | Freq: Once | INTRAMUSCULAR | Status: AC
  Administered 2022-09-24: 150 mg via INTRAMUSCULAR

## 2022-09-24 NOTE — Progress Notes (Signed)
    Post Partum Visit Note  Julie [name] is a 32 y.o. O2U2353 s/p 2/13 SVD/intact perineum after 39wk IOL for GDMA1.  Anesthesia: epidural. Postpartum course has been uncomplicated. Baby is doing wel. Baby is feeding by breast only. Bleeding staining only. Bowel function is normal. Bladder function is normal. Patient is not sexually active. Contraception method is abstinence; Depo-Provera injections is desired. Postpartum depression screening: negative.  No s/s of pre-eclampsia   Upstream - 09/24/22 0839       Pregnancy Intention Screening   Does the patient want to become pregnant in the next year? No    Does the patient's partner want to become pregnant in the next year? Unsure    Would the patient like to discuss contraceptive options today? No      Contraception Wrap Up   Current Method No Contraceptive Precautions    End Method Hormonal Injection            The pregnancy intention screening data noted above was reviewed. Potential methods of contraception were discussed. The patient elected to proceed with Hormonal Injection.   Edinburgh Postnatal Depression Scale - 09/24/22 0839       Edinburgh Postnatal Depression Scale:  In the Past 7 Days   I have been able to laugh and see the funny side of things. 0    I have looked forward with enjoyment to things. 0    I have blamed myself unnecessarily when things went wrong. 0    I have been anxious or worried for no good reason. 0    I have felt scared or panicky for no good reason. 0    Things have been getting on top of me. 0    I have been so unhappy that I have had difficulty sleeping. 1    I have felt sad or miserable. 0    I have been so unhappy that I have been crying. 0    The thought of harming myself has occurred to me. 0    Edinburgh Postnatal Depression Scale Total 1             Review of Systems Pertinent items noted in HPI and remainder of comprehensive ROS otherwise  negative.  Objective:  BP (!) 129/91   Pulse 80   Wt 196 lb (88.9 kg)   LMP 11/27/2021 Comment: SAB  Breastfeeding Yes   BMI 35.85 kg/m    Repeat 124/87  General: NAD Assessment:   Normal postpartum visit  Plan:  *PP: routine care. Declines pap today; patient amenable to doing it in 3 months. Patient has used depo before and would like to do it again. Depo given today *GDMA1: GTT today  RTC: 3 months for annual, pap, depo provera.    Aletha Halim, MD [facility]

## 2022-09-25 ENCOUNTER — Encounter: Payer: Self-pay | Admitting: Obstetrics and Gynecology

## 2022-09-25 LAB — GLUCOSE TOLERANCE, 2 HOURS
Glucose, 2 hour: 77 mg/dL (ref 70–139)
Glucose, GTT - Fasting: 84 mg/dL (ref 70–99)

## 2022-10-07 ENCOUNTER — Emergency Department (HOSPITAL_BASED_OUTPATIENT_CLINIC_OR_DEPARTMENT_OTHER)
Admission: EM | Admit: 2022-10-07 | Discharge: 2022-10-07 | Disposition: A | Discharge status: Home / Self Care | Payer: Medicaid Other | Attending: Emergency Medicine | Admitting: Emergency Medicine

## 2022-10-07 ENCOUNTER — Encounter (HOSPITAL_BASED_OUTPATIENT_CLINIC_OR_DEPARTMENT_OTHER): Payer: Self-pay

## 2022-10-07 ENCOUNTER — Other Ambulatory Visit: Payer: Self-pay

## 2022-10-07 DIAGNOSIS — H1012 Acute atopic conjunctivitis, left eye: Secondary | ICD-10-CM | POA: Insufficient documentation

## 2022-10-07 DIAGNOSIS — H6122 Impacted cerumen, left ear: Secondary | ICD-10-CM | POA: Diagnosis not present

## 2022-10-07 DIAGNOSIS — H9202 Otalgia, left ear: Secondary | ICD-10-CM | POA: Diagnosis present

## 2022-10-07 DIAGNOSIS — H6123 Impacted cerumen, bilateral: Secondary | ICD-10-CM | POA: Diagnosis not present

## 2022-10-07 MED ORDER — FLUORESCEIN SODIUM 1 MG OP STRP
1.0000 | ORAL_STRIP | Freq: Once | OPHTHALMIC | Status: AC
  Administered 2022-10-07: 1 via OPHTHALMIC
  Filled 2022-10-07: qty 1

## 2022-10-07 MED ORDER — CARBAMIDE PEROXIDE 6.5 % OT SOLN: 5.0000 [drp] | Freq: Two times a day (BID) | OTIC | 0 refills | Status: AC | PRN

## 2022-10-07 MED ORDER — TETRACAINE HCL 0.5 % OP SOLN
2.0000 [drp] | Freq: Once | OPHTHALMIC | Status: AC
  Administered 2022-10-07: 2 [drp] via OPHTHALMIC
  Filled 2022-10-07: qty 4

## 2022-10-07 MED ORDER — OLOPATADINE HCL 0.2 % OP SOLN: 1.0000 [drp] | Freq: Two times a day (BID) | OPHTHALMIC | 0 refills | Status: AC | PRN

## 2022-10-07 NOTE — Discharge Instructions (Addendum)
Thank you for letting us take care of you today.  I do not see any cuts or injuries to your eye. It does not look like a bacterial infection but more allergic. You may use Pataday drops to help with the irritation and symptoms. You can get these over the counter but I have also prescribed them if needed.  Your ear does not look infected. You do have a lot of wax on both sides. I recommend cleaning your ears out in the shower to get rid of residual wax. You may also use a medication called Debrox which can help get rid of wax. You can get this over the counter but it is prescribed as well.  Please follow up with an eye doctor as discussed for a better exam of your eyes and vision. I also recommend establishing care with a primary care provider for mild concerns or care of chronic conditions. I have provided 2 primary care clinics you may contact to schedule an appointment of you may go to a PCP of your own choosing. For any new or worsening symptoms, please return to nearest emergency department for re-evaluation.

## 2022-10-07 NOTE — ED Triage Notes (Signed)
Patient arrives ambulatory to ED with complaints of left eye and ear pain x1 day.

## 2022-10-07 NOTE — ED Provider Notes (Signed)
[facility] Provider Note   CSN: [id number] Arrival date & time: 10/07/22  1411     History  Chief Complaint  Patient presents with   Ear Pain    left   Eye Pain    left    Julie [name] is a 32 y.o. female who presents to ED c/o left ear pain and left eye irritation for the last 3 days. Pt reports she just moved here and has been working outside a lot as a result. She states her young child has similar symptoms. She states vision feels a little blurry on the left due to the swelling around her eye but she has not had any major changes or loss of vision. She denies fever, cough, congestion, shortness of breath. She states she has not seen an eye doctor or had a vision exam in many years. She states eye is frequently watering and feels irritated like it may have a foreign body present.     Home Medications No Rx medications  Allergies    Patient has no known allergies.    Review of Systems   Review of Systems  All other systems reviewed and are negative.   Physical Exam Updated Vital Signs BP (!) 133/92 (BP Location: Right Arm)   Pulse 84   Temp 98.1 F (36.7 C) (Oral)   Resp 16   Ht 5\' 2"  (1.575 m)   Wt 88.9 kg   SpO2 100%   Breastfeeding Yes   BMI 35.85 kg/m  Physical Exam Vitals and nursing note reviewed.  Constitutional:      General: She is not in acute distress.    Appearance: Normal appearance. She is not ill-appearing or toxic-appearing.  HENT:     Head: Normocephalic and atraumatic.     Right Ear: Tympanic membrane, ear canal and external ear normal.     Left Ear: Tympanic membrane, ear canal and external ear normal.     Ears:     Comments: TM bilaterally partially obstructed by cerumen but area able to be visualized is non-erythematous, non-bulging, and intact    Mouth/Throat:     Mouth: Mucous membranes are moist.  Eyes:     General: Lids are everted, no foreign bodies appreciated. Vision  grossly intact. Gaze aligned appropriately. No visual field deficit or scleral icterus.       Right eye: No foreign body, discharge or hordeolum.        Left eye: No foreign body or hordeolum.     Extraocular Movements: Extraocular movements intact.     Right eye: No nystagmus.     Left eye: No nystagmus.     Conjunctiva/sclera:     Right eye: Right conjunctiva is not injected. No chemosis, exudate or hemorrhage.    Left eye: Left conjunctiva is injected (minimally with watery drainage). No chemosis, exudate or hemorrhage.    Pupils: Pupils are equal, round, and reactive to light.     Right eye: Pupil is not sluggish. No corneal abrasion or fluorescein uptake.     Left eye: Pupil is not sluggish. No corneal abrasion or fluorescein uptake.     Funduscopic exam:    Right eye: No hemorrhage, exudate, AV nicking or papilledema. Red reflex present.        Left eye: No hemorrhage, exudate, AV nicking or papilledema. Red reflex present.    Comments: Minimal upper eyelid swelling on the left, otherwise lids are normal, brown irregular birthmark to  medial aspect of left eye - pt states unchanged from baseline  Cardiovascular:     Rate and Rhythm: Normal rate and regular rhythm.     Heart sounds: No murmur heard. Pulmonary:     Effort: Pulmonary effort is normal.     Breath sounds: Normal breath sounds.  Abdominal:     General: Abdomen is flat.     Palpations: Abdomen is soft.     Tenderness: There is no abdominal tenderness.  Musculoskeletal:        General: Normal range of motion.     Cervical back: Normal range of motion and neck supple. No rigidity.     Right lower leg: No edema.     Left lower leg: No edema.  Skin:    General: Skin is warm and dry.     Capillary Refill: Capillary refill takes less than 2 seconds.  Neurological:     General: No focal deficit present.     Mental Status: She is alert and oriented to person, place, and time.     Cranial Nerves: No cranial nerve deficit.      Motor: No weakness.  Psychiatric:        Behavior: Behavior normal.     ED Results / Procedures / Treatments   Labs (all labs ordered are listed, but only abnormal results are displayed) Labs Reviewed - No data to display  EKG None  Radiology No results found.  Procedures Procedures   Visual Acuity Bilateral Distance: 20/25 R Distance: 20/25 L Distance: 20/40    Medications Ordered in ED Medications  fluorescein ophthalmic strip 1 strip (1 strip Both Eyes Given 10/07/22 1509)  tetracaine (PONTOCAINE) 0.5 % ophthalmic solution 2 drop (2 drops Both Eyes Given 10/07/22 1509)    ED Course/ Medical Decision Making/ A&P                             Medical Decision Making Risk OTC drugs.   Medical Decision Making:   [name] is a 32 y.o. female who presented to the ED today with left eye and ear pain detailed above.     Complete initial physical exam performed, notably the patient was in no acute distress. Eye exam above mostly unremarkable. Pt had mild swelling to left upper eyelid with watery drainage and mild conjunctival injection. Otherwise exam normal. She did have significant cerumen bilaterally but tympanic membranes were intact and did not appear acutely infected.  Reviewed and confirmed nursing documentation for past medical history, family history, social history.    Initial Assessment:   With the patient's presentation of eye and ear pain, differential diagnosis includes but is not limited to allergic conjunctivitis, viral conjunctivitis, bacterial conjunctivitis, viral syndrome, corneal abrasion, foreign body. Low suspicion for ocular/neurologic emergencies such as globe rupture, retinal detachment, acute angle closure glaucoma, central retinal artery occlusion, CVA, TIA.  This is most consistent with an acute complicated illness  Initial Plan:  Visual acuity Eye exam as above including fluorescein staining Objective evaluation as  reviewed   Final Assessment and Plan:   This is a 32 year old female presenting to ED c/o left eye and ear pain for the last 3 days. Pt notes recently moving and being exposed to outdoors more. Also notes young child has similar symptoms. No associated infectious symptoms such as fever, chills, nausea, vomiting. No neurologic deficits such as focal weakness, severe headache, speech complaints, loss of vision.  Pt notes vision to left is slightly blurry from her baseline but she has not had eye exam in many years. Vision slightly decreased on left compared to right, see visual acuity above, but still within normal limits and vision grossly intact with no visual field deficits. Vital signs unremarkable. On exam, pt has mild swelling to left upper eyelid, watery drainage from eye, and mild conjunctival infection. No purulent drainage, no significant swelling or injection. More consistent with allergic vs viral conjunctivitis than bacterial etiology. Cerumen bilaterally but tympanic membranes are intact and do not appear acutely infected. No fluorescein uptake, PERRL, EOMI, no other significant abnormalities noted on eye exam as documented above. Overall, reassuring exam and do not suspect ocular emergency at this time. Discussed findings, symptomatic treatment, and need for follow up with eye and/or PCP with pt. Pt expressed understanding of this. Strict ED return precautions given, all questions answered, and stable for discharge.    Clinical Impression:  1. Acute atopic conjunctivitis of left eye   2. Excessive cerumen in ear canal, bilateral      Discharge           Final Clinical Impression(s) / ED Diagnoses Final diagnoses:  Acute atopic conjunctivitis of left eye  Excessive cerumen in ear canal, bilateral    Rx / DC Orders ED Discharge Orders          Ordered    Olopatadine HCl 0.2 % SOLN  2 times daily PRN        10/07/22 1524    carbamide peroxide (DEBROX) 6.5 % OTIC solution  2  times daily PRN        10/07/22 1524              Suzzette Righter, PA-C 10/07/22 1541    Gareth Morgan, MD 10/08/22 1429

## 2022-10-11 ENCOUNTER — Encounter: Payer: Self-pay | Admitting: *Deleted

## 2022-10-15 DIAGNOSIS — Z419 Encounter for procedure for purposes other than remedying health state, unspecified: Secondary | ICD-10-CM | POA: Diagnosis not present

## 2022-10-15 NOTE — Progress Notes (Unsigned)
New Patient Office Visit  Subjective    Patient ID: Julie [name], female    DOB: 02-08-1991  Age: 32 y.o. MRN: [id number]  CC: No chief complaint on file.   HPI Julie [name] presents to establish care PMH- hx gestational diabetes,  Surgeries- Allergies- Social- alcohol, tobacco use, drug use*** FH-   Outpatient Encounter Medications as of 10/16/2022  Medication Sig   acetaminophen (TYLENOL) 325 MG tablet Take 2 tablets (650 mg total) by mouth every 4 (four) hours as needed (for pain scale < 4). (Patient not taking: Reported on 09/24/2022)   benzocaine-Menthol (DERMOPLAST) 20-0.5 % AERO Apply 1 Application topically as needed for irritation (perineal discomfort). (Patient not taking: Reported on 09/24/2022)   ibuprofen (ADVIL) 600 MG tablet Take 1 tablet (600 mg total) by mouth every 6 (six) hours. (Patient not taking: Reported on 09/24/2022)   prenatal vitamin w/FE, FA (PRENATAL 1 + 1) 27-1 MG TABS tablet Take 1 tablet by mouth daily at 12 noon. (Patient not taking: Reported on 09/24/2022)   senna-docusate (SENOKOT-S) 8.6-50 MG tablet Take 2 tablets by mouth daily. (Patient not taking: Reported on 09/24/2022)   No facility-administered encounter medications on file as of 10/16/2022.    Past Medical History:  Diagnosis Date   Anemia    GDM, class A1 08/28/2022   09/2022 PP GTT negative.    Gestational diabetes    Pregnancy induced hypertension    Pregnancy induced hypertension 02/01/2022    Past Surgical History:  Procedure Laterality Date   NO PAST SURGERIES     WISDOM TOOTH EXTRACTION      Family History  Problem Relation Age of Onset   Diabetes Mother    Diabetes Paternal Aunt    Asthma Neg Hx    Cancer Neg Hx    Heart disease Neg Hx    Hypertension Neg Hx    Stroke Neg Hx     Social History   Socioeconomic History   Marital status: Married    Spouse name: Not on file   Number of children: Not on file   Years of education: Not on  file   Highest education level: Not on file  Occupational History   Not on file  Tobacco Use   Smoking status: Never   Smokeless tobacco: Never  Vaping Use   Vaping Use: Never used  Substance and Sexual Activity   Alcohol use: No   Drug use: No   Sexual activity: Yes    Birth control/protection: None  Other Topics Concern   Not on file  Social History Narrative   Not on file   Social Determinants of Health   Financial Resource Strain: Not on file  Food Insecurity: No Food Insecurity (08/28/2022)   Hunger Vital Sign    Worried About Running Out of Food in the Last Year: Never true    Ran Out of Food in the Last Year: Never true  Transportation Needs: No Transportation Needs (08/28/2022)   PRAPARE - Hydrologist (Medical): No    Lack of Transportation (Non-Medical): No  Physical Activity: Not on file  Stress: Not on file  Social Connections: Not on file  Intimate Partner Violence: Not At Risk (08/28/2022)   Humiliation, Afraid, Rape, and Kick questionnaire    Fear of Current or Ex-Partner: No    Emotionally Abused: No    Physically Abused: No    Sexually Abused: No    ROS  Objective    There were no vitals taken for this visit.  Physical Exam  {Labs (Optional):23779}    Assessment & Plan:   Problem List Items Addressed This Visit   None   No follow-ups on file.   Gerrit Heck, MD

## 2022-10-16 ENCOUNTER — Encounter: Payer: Self-pay | Admitting: Student

## 2022-10-16 ENCOUNTER — Ambulatory Visit (INDEPENDENT_AMBULATORY_CARE_PROVIDER_SITE_OTHER): Payer: Medicaid Other | Admitting: Student

## 2022-10-16 VITALS — BP 110/83 | HR 77 | Ht 62.0 in | Wt 199.1 lb

## 2022-10-16 DIAGNOSIS — Z7689 Persons encountering health services in other specified circumstances: Secondary | ICD-10-CM

## 2022-10-16 DIAGNOSIS — K644 Residual hemorrhoidal skin tags: Secondary | ICD-10-CM | POA: Diagnosis not present

## 2022-10-16 DIAGNOSIS — Z13228 Encounter for screening for other metabolic disorders: Secondary | ICD-10-CM | POA: Diagnosis not present

## 2022-10-16 NOTE — Assessment & Plan Note (Signed)
Denies any bleeding or irritation today.  I discussed symptomatic measures to try for patient.  After pregnancy this may take a while for this to go down. -Hemorrhoid information given to patient -Increase fiber, start fiber supplementation -Increase water intake -Calmoseptine if irritation -Return if having bleeding/any symptoms

## 2022-10-16 NOTE — Patient Instructions (Signed)
It was great to see you! Thank you for allowing me to participate in your care!   Our plans for today:  - I am getting a metabolic panel and will let you know what that shows - I recommend a fiber supplement (benefiber, metamucil) and increasing your water intake to help you have more regular bowel movements that are soft and can help with your hemmorhoid -calmoseptine if you have any irritation in that area  Take care and seek immediate care sooner if you develop any concerns.  Gerrit Heck, MD

## 2022-10-17 LAB — COMPREHENSIVE METABOLIC PANEL
ALT: 12 IU/L (ref 0–32)
AST: 10 IU/L (ref 0–40)
Albumin/Globulin Ratio: 1.3 (ref 1.2–2.2)
Albumin: 4.1 g/dL (ref 4.0–5.0)
Alkaline Phosphatase: 95 IU/L (ref 44–121)
BUN/Creatinine Ratio: 16 (ref 9–23)
BUN: 16 mg/dL (ref 6–20)
Bilirubin Total: 0.2 mg/dL (ref 0.0–1.2)
CO2: 21 mmol/L (ref 20–29)
Calcium: 9.4 mg/dL (ref 8.7–10.2)
Chloride: 109 mmol/L — ABNORMAL HIGH (ref 96–106)
Creatinine, Ser: 0.99 mg/dL (ref 0.57–1.00)
Globulin, Total: 3.2 g/dL (ref 1.5–4.5)
Glucose: 88 mg/dL (ref 70–99)
Potassium: 4.4 mmol/L (ref 3.5–5.2)
Sodium: 143 mmol/L (ref 134–144)
Total Protein: 7.3 g/dL (ref 6.0–8.5)
eGFR: 79 mL/min/{1.73_m2} (ref >59)

## 2022-11-14 DIAGNOSIS — Z419 Encounter for procedure for purposes other than remedying health state, unspecified: Secondary | ICD-10-CM | POA: Diagnosis not present

## 2022-11-19 ENCOUNTER — Encounter: Payer: Self-pay | Admitting: Student

## 2022-12-15 DIAGNOSIS — Z419 Encounter for procedure for purposes other than remedying health state, unspecified: Secondary | ICD-10-CM | POA: Diagnosis not present

## 2023-01-14 DIAGNOSIS — Z419 Encounter for procedure for purposes other than remedying health state, unspecified: Secondary | ICD-10-CM | POA: Diagnosis not present

## 2023-02-14 DIAGNOSIS — Z419 Encounter for procedure for purposes other than remedying health state, unspecified: Secondary | ICD-10-CM | POA: Diagnosis not present

## 2023-03-17 DIAGNOSIS — Z419 Encounter for procedure for purposes other than remedying health state, unspecified: Secondary | ICD-10-CM | POA: Diagnosis not present

## 2023-04-16 DIAGNOSIS — Z419 Encounter for procedure for purposes other than remedying health state, unspecified: Secondary | ICD-10-CM | POA: Diagnosis not present

## 2023-05-06 ENCOUNTER — Other Ambulatory Visit: Payer: Self-pay

## 2023-05-06 ENCOUNTER — Ambulatory Visit (INDEPENDENT_AMBULATORY_CARE_PROVIDER_SITE_OTHER): Payer: Medicaid Other | Admitting: *Deleted

## 2023-05-06 DIAGNOSIS — Z30019 Encounter for initial prescription of contraceptives, unspecified: Secondary | ICD-10-CM

## 2023-05-06 NOTE — Progress Notes (Signed)
Here for depo-provera. Last given in our office at postpartum visit 09/24/22.  States she got it in July In [place] because she was traveling there. States she had unprotected intercourse last week. Discussed with supervisor and advised to follow protocol and if she can show proof of injection in [place] we can give injection today; if she cannot show proof then we must follow protocol and have her abstain from intercourse or use condoms and come back in one week to get injection. She could not show proof of injection so I explained protocol to her and scheduled a visit in one week. She voices understanding. Nancy Fetter

## 2023-05-13 ENCOUNTER — Ambulatory Visit (INDEPENDENT_AMBULATORY_CARE_PROVIDER_SITE_OTHER): Payer: Medicaid Other | Admitting: *Deleted

## 2023-05-13 ENCOUNTER — Other Ambulatory Visit: Payer: Self-pay

## 2023-05-13 VITALS — BP 113/82 | HR 64 | Ht 62.0 in | Wt 204.6 lb

## 2023-05-13 DIAGNOSIS — Z3042 Encounter for surveillance of injectable contraceptive: Secondary | ICD-10-CM

## 2023-05-13 DIAGNOSIS — Z3202 Encounter for pregnancy test, result negative: Secondary | ICD-10-CM | POA: Diagnosis not present

## 2023-05-13 LAB — POCT PREGNANCY, URINE: Preg Test, Ur: NEGATIVE

## 2023-05-13 MED ORDER — MEDROXYPROGESTERONE ACETATE 150 MG/ML IM SUSY: 150.0000 mg | PREFILLED_SYRINGE | Freq: Once | INTRAMUSCULAR | Status: AC

## 2023-05-13 NOTE — Progress Notes (Signed)
Here for depo-provera. Has not had intercourse since last visit 05/06/23. Per protocol 2 weeks since intercourse, upt negative today. Injection given without complaint. Sent to desk to schedule next injection. Nancy Fetter

## 2023-05-17 DIAGNOSIS — Z419 Encounter for procedure for purposes other than remedying health state, unspecified: Secondary | ICD-10-CM | POA: Diagnosis not present

## 2023-06-16 DIAGNOSIS — Z419 Encounter for procedure for purposes other than remedying health state, unspecified: Secondary | ICD-10-CM | POA: Diagnosis not present

## 2023-07-17 DIAGNOSIS — Z419 Encounter for procedure for purposes other than remedying health state, unspecified: Secondary | ICD-10-CM | POA: Diagnosis not present

## 2023-07-25 ENCOUNTER — Ambulatory Visit: Payer: Medicaid Other

## 2023-08-17 DIAGNOSIS — Z419 Encounter for procedure for purposes other than remedying health state, unspecified: Secondary | ICD-10-CM | POA: Diagnosis not present

## 2023-08-26 ENCOUNTER — Ambulatory Visit (INDEPENDENT_AMBULATORY_CARE_PROVIDER_SITE_OTHER): Payer: Medicaid Other | Admitting: Student

## 2023-08-26 VITALS — BP 113/74 | HR 66 | Ht 62.0 in | Wt 203.2 lb

## 2023-08-26 DIAGNOSIS — Z3009 Encounter for other general counseling and advice on contraception: Secondary | ICD-10-CM

## 2023-08-26 LAB — POCT URINE PREGNANCY: Preg Test, Ur: NEGATIVE

## 2023-08-26 MED ORDER — MEDROXYPROGESTERONE ACETATE 150 MG/ML IM SUSP
150.0000 mg | Freq: Once | INTRAMUSCULAR | Status: AC
  Administered 2023-08-26: 150 mg via INTRAMUSCULAR

## 2023-08-26 NOTE — Progress Notes (Signed)
 Patient was initially wanting the Depo Shot in her deltoid. I explained to the patient that the thickness of this medication. Is preferred to be administered in a large muscle like the hip and that it would also hurt less. Patient decided to do the medication in the hip.

## 2023-08-26 NOTE — Patient Instructions (Signed)
 You will be due for another Depo in 3 months Please take a pregnancy test at home in 2 weeks Please think about other options listed below

## 2023-08-26 NOTE — Progress Notes (Signed)
    SUBJECTIVE:   CHIEF COMPLAINT / HPI: Depo  Last Depo given 05/13/2023 Has been on it after giving birth in 2024 Presents for a Depo-Provera  injection, her third dose.  She previously used Nexplanon, but discontinued it prior to last child She is aware of the potential long-term effects on bone health.  She reports no menses due to breastfeeding her [age]-year-old child.  She had unprotected intercourse last week She is considering other long-term contraceptive options, including an IUD  PERTINENT  PMH / PSH: reviewed  OBJECTIVE:   BP 113/74   Pulse 66   Ht 5\' 2"  (1.575 m)   Wt 203 lb 4 oz (92.2 kg)   SpO2 100%   BMI 37.17 kg/m   General: Well appearing, NAD, awake, alert, responsive to questions Head: Normocephalic atraumatic CV: Regular rate and rhythm no murmurs rubs or gallops Respiratory: Clear to ausculation bilaterally, no wheezes rales or crackles, chest rises symmetrically,  no increased work of breathing Extremities: Moves upper and lower extremities freely, no edema in LE  ASSESSMENT/PLAN:   Assessment & Plan Birth control counseling Presents for Depo shot.  Out of 70-month window, urine pregnancy negative here. -Depo given today, 3 month f/u -Discussed other birth control options and patient will let us  know whether she would like to go forward with IUD -Advised patient to take pregnancy test in 2 weeks given recent unprotected intercourse (discussed about not fetal toxic)   Genora Kidd, MD [facility]

## 2023-08-26 NOTE — Addendum Note (Signed)
 Addended by: Ulysess Witz on: 08/26/2023 11:39 AM   Modules accepted: Orders

## 2023-09-14 DIAGNOSIS — Z419 Encounter for procedure for purposes other than remedying health state, unspecified: Secondary | ICD-10-CM | POA: Diagnosis not present

## 2023-10-26 DIAGNOSIS — Z419 Encounter for procedure for purposes other than remedying health state, unspecified: Secondary | ICD-10-CM | POA: Diagnosis not present

## 2023-11-07 ENCOUNTER — Ambulatory Visit: Admitting: Family Medicine

## 2023-11-07 ENCOUNTER — Encounter: Payer: Self-pay | Admitting: Family Medicine

## 2023-11-07 VITALS — BP 116/85 | HR 79 | Ht 62.0 in | Wt 206.0 lb

## 2023-11-07 DIAGNOSIS — R198 Other specified symptoms and signs involving the digestive system and abdomen: Secondary | ICD-10-CM

## 2023-11-07 DIAGNOSIS — K219 Gastro-esophageal reflux disease without esophagitis: Secondary | ICD-10-CM | POA: Diagnosis not present

## 2023-11-07 DIAGNOSIS — Z01419 Encounter for gynecological examination (general) (routine) without abnormal findings: Secondary | ICD-10-CM

## 2023-11-07 MED ORDER — NORETHINDRONE 0.35 MG PO TABS: 1.0000 | ORAL_TABLET | Freq: Every day | ORAL | 5 refills | Status: AC

## 2023-11-07 MED ORDER — OMEPRAZOLE 40 MG PO CPDR: 40.0000 mg | DELAYED_RELEASE_CAPSULE | Freq: Every day | ORAL | 0 refills | Status: DC

## 2023-11-07 NOTE — Patient Instructions (Signed)
 It was wonderful to see you today!  Today we discussed your birth control options as well as the abdominal pain you have been having.  For your birth control I have started you on Micronor .  You can take this pill starting tomorrow, and because you are still within the window of effectiveness for your Depo shot you do not need to worry about secondary birth control options during the transition,  nor do we need to test you for pregnancy at this time.  For your stomach pain and increased rumbling, I have prescribed you a course of omeprazole .  You can take this medication every day for the next 30 days, and follow-up with Dr. Leocadia Rains on May 23.   Please call [phone] with any questions about today's appointment.   If you need any additional refills, please call your pharmacy before calling the office.  Julie Calandra, DO Family Medicine

## 2023-11-07 NOTE — Assessment & Plan Note (Signed)
-   Ordered trial of omeprazole  40 mg daily -Patient to follow-up with PCP in 1 month

## 2023-11-07 NOTE — Progress Notes (Signed)
    SUBJECTIVE:   CHIEF COMPLAINT / HPI:   Wants to change BC, currently using depo. Not have any current concerns about her birth control method but would prefer to switch to a pill form.  She is within her current coverage window for Depo.  Stomach complaint Reports feeling heartburn sensation only over the last 2 months or so.  It is not triggered by anything that she has noticed nor does anything seem to make it worse or better.  During this time she has also noted constant stomach rumbling, even when she is not hungry.  This was most notable during Ramadan when she and her family were fasting.  PERTINENT  PMH / PSH: None  OBJECTIVE:   BP 116/85   Pulse 79   Ht 5\' 2"  (1.575 m)   Wt 206 lb (93.4 kg)   SpO2 100%   BMI 37.68 kg/m   General: Alert, oriented.  No distress Abdominal: Flat, soft, nontender.  No guarding or masses.  Bowel sounds present and constant in epigastric region  ASSESSMENT/PLAN:   Assessment & Plan Encounter for routine examination for contraception - Ordered Micronor  0.35 mg tablets, daily Gastroesophageal reflux disease without esophagitis - Ordered trial of omeprazole  40 mg daily -Patient to follow-up with PCP in 1 month Borborygmus Benign, likely related to gastroesophageal reflux.   Rayma Calandra, DO [facility]

## 2023-11-21 IMAGING — US US OB < 14 WEEKS - US OB TV
1 series · 15 of 28 positions shown · non-contrast
Comparison: None.

CLINICAL DATA: Abdominal pain

EXAM:
OBSTETRIC <14 WK US AND TRANSVAGINAL OB US
TECHNIQUE: Both transabdominal and transvaginal ultrasound examinations were
performed for complete evaluation of the gestation as well as the
maternal uterus, adnexal regions, and pelvic cul-de-sac.
Transvaginal technique was performed to assess early pregnancy.

[Series 1: us ob < 14 weeks - us ob tv · 15 of 64 slices shown]
[im 1/64]
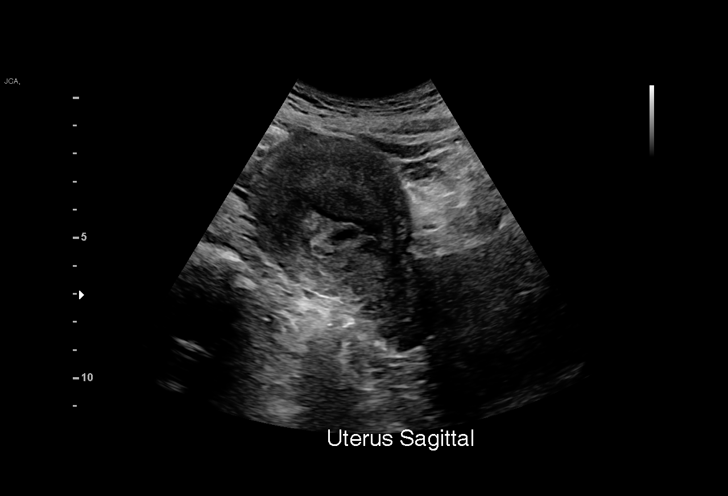
[im 5/64]
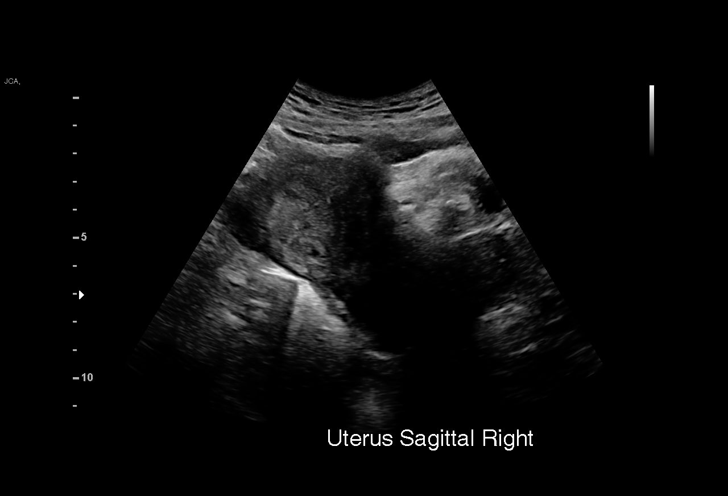
[im 10/64]
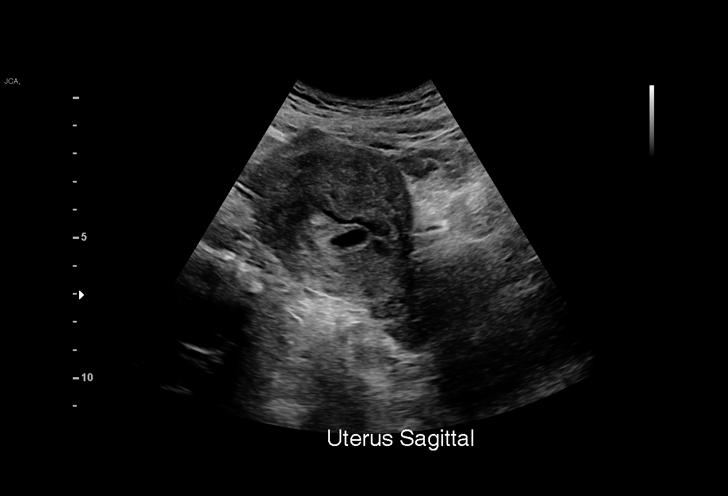
[im 15/64]
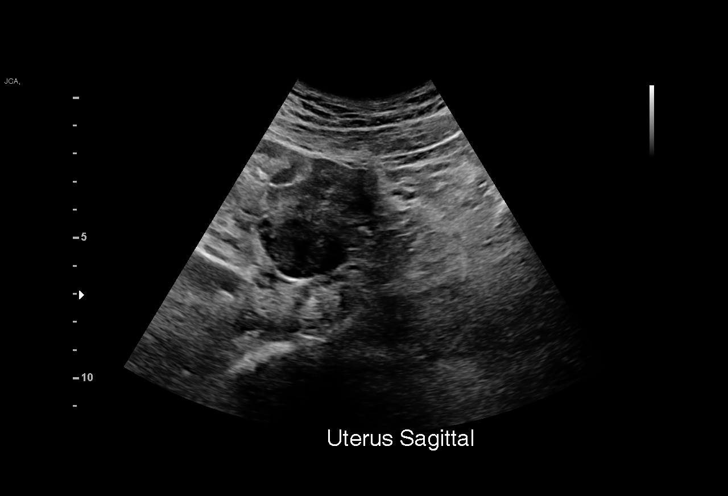
[im 19/64]
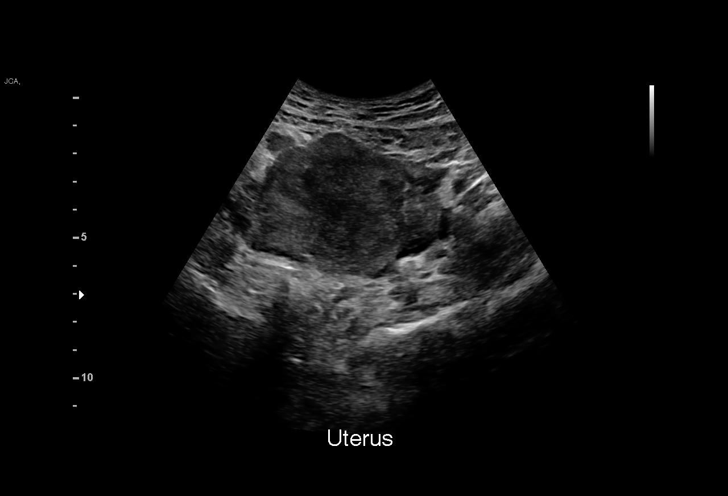
[im 24/64]
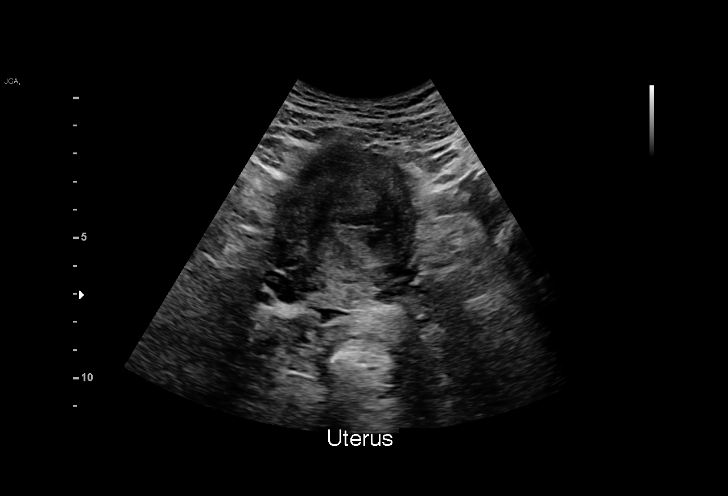
[im 29/64]
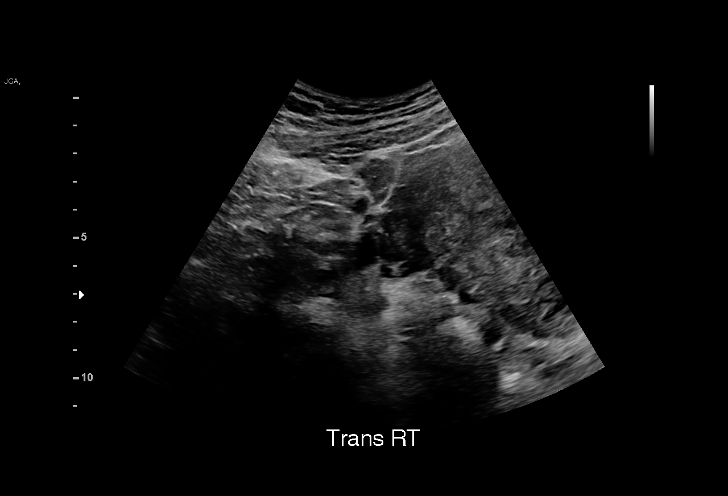
[im 33/64]
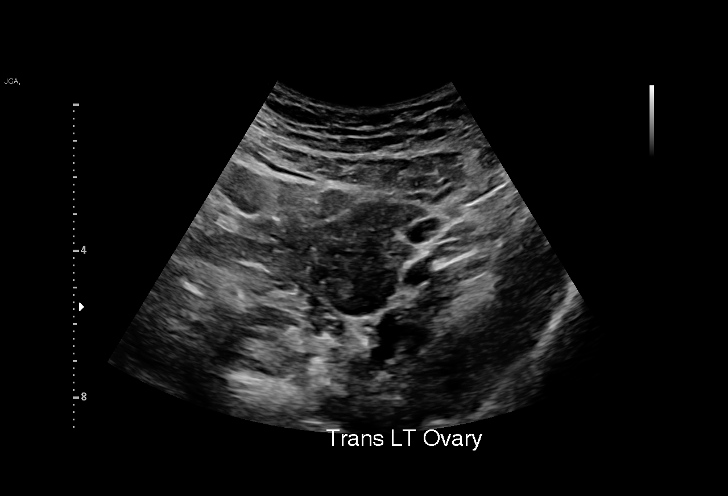
[im 36/64]
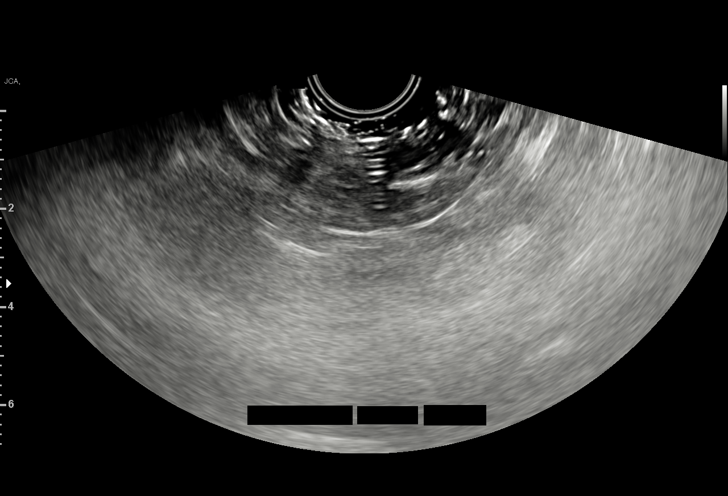
[im 40/64]
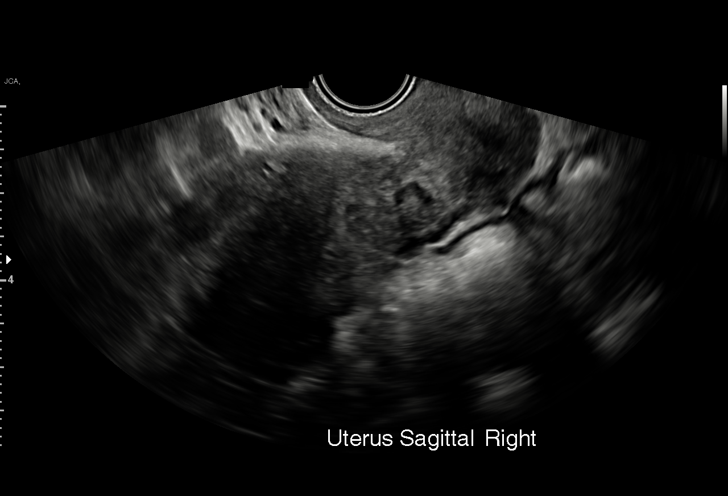
[im 45/64]
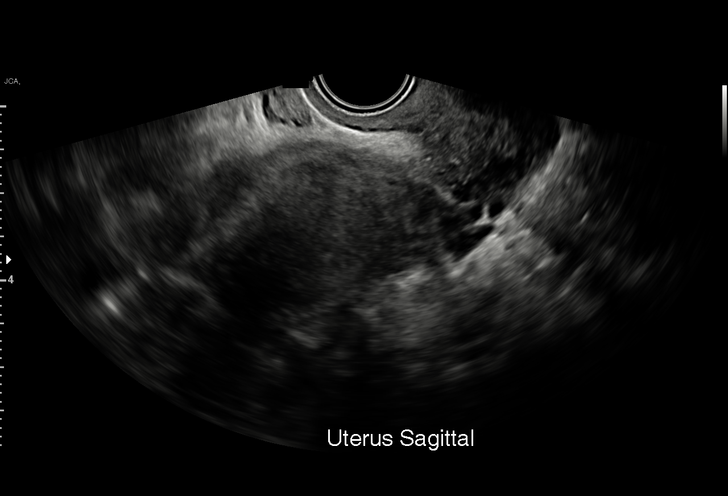
[im 50/64]
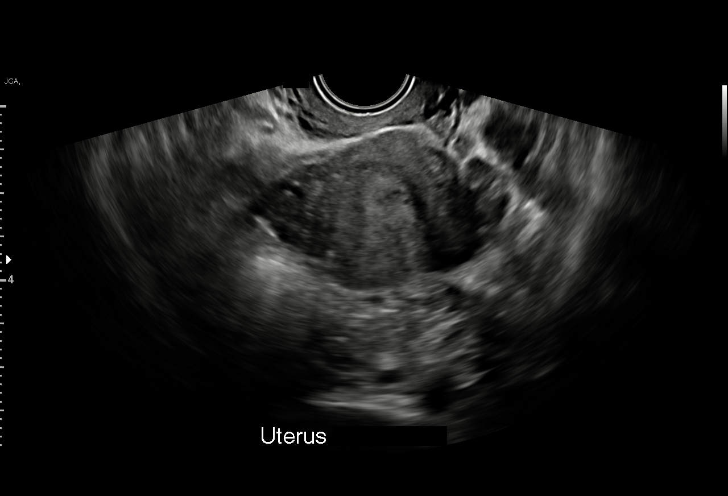
[im 54/64]
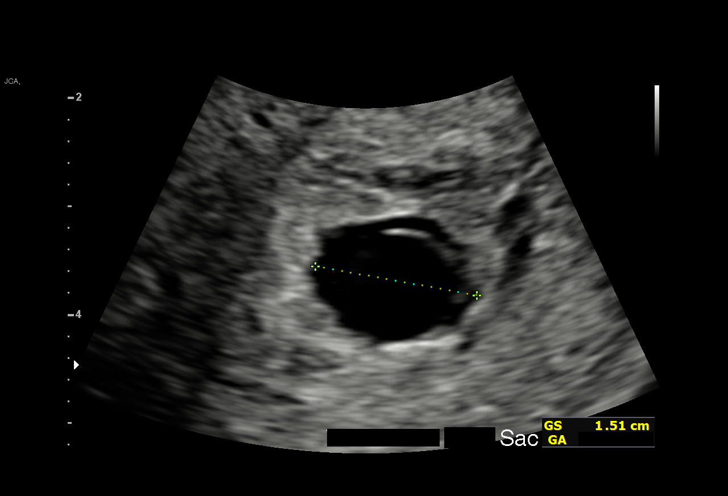
[im 59/64]
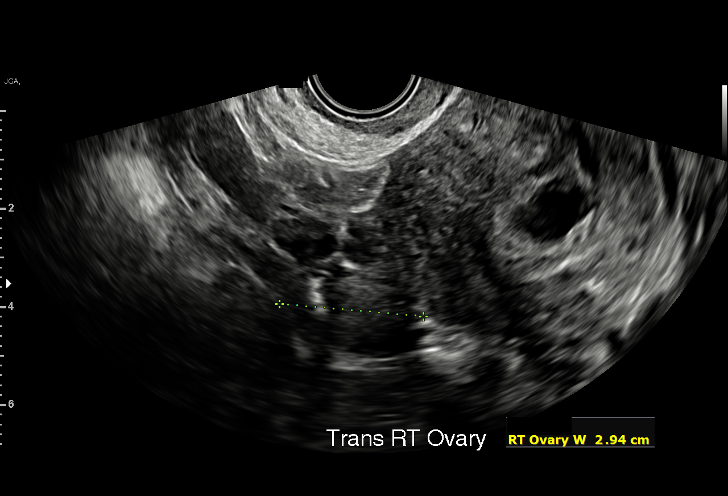
[im 64/64]
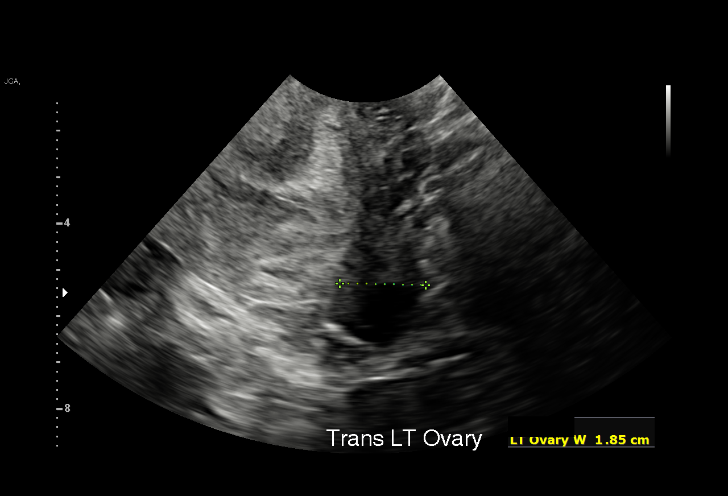

[15 of 28 positions shown; findings below may reference images not displayed]

FINDINGS: Intrauterine gestational sac: Single

Yolk sac:  Not Visualized.

Embryo:  Not Visualized.

Cardiac Activity: Not Visualized.

Heart Rate:   bpm

MSD: 11.4 mm   5 w   6 d

CRL:    mm    w    d                  US EDC:

Subchorionic hemorrhage:  None visualized.

Maternal uterus/adnexae: No adnexal mass or abnormal free fluid.
IMPRESSION: Early intrauterine gestational sac, 5 weeks 6 days by mean sac
diameter. No yolk sac or fetal pole currently. This could be
followed with repeat ultrasound in 10-14 days to ensure expected
progression.

No acute maternal findings.

## 2023-11-25 DIAGNOSIS — Z419 Encounter for procedure for purposes other than remedying health state, unspecified: Secondary | ICD-10-CM | POA: Diagnosis not present

## 2023-12-06 ENCOUNTER — Other Ambulatory Visit: Payer: Self-pay | Admitting: Family Medicine

## 2023-12-16 ENCOUNTER — Ambulatory Visit: Payer: Self-pay | Admitting: Student

## 2023-12-16 NOTE — Progress Notes (Deleted)
    SUBJECTIVE:   CHIEF COMPLAINT / HPI: Reflux follow-up  Discussed the use of AI scribe software for clinical note transcription with the patient, who gave verbal consent to proceed.  Was started on omeprazole  40 mg on 4/24 and recommended 1 month follow-up  History of Present Illness    PERTINENT  PMH / PSH: Alpha thalassemia  OBJECTIVE:   There were no vitals taken for this visit.  ***  ASSESSMENT/PLAN:   Assessment & Plan    Assessment and Plan Assessment & Plan    Julie Kidd, MD [facility]

## 2023-12-24 ENCOUNTER — Encounter: Payer: Self-pay | Admitting: *Deleted

## 2023-12-25 IMAGING — US US OB TRANSVAGINAL
1 series · 15 of 28 positions shown · non-contrast
Comparison: September 26, 2021.

CLINICAL DATA: Pregnancy of unknown anatomic location.

EXAM:
OBSTETRIC <14 WK ULTRASOUND
TECHNIQUE: Transabdominal ultrasound was performed for evaluation of the
gestation as well as the maternal uterus and adnexal regions.

[Series 1: us ob transvaginal · 15 of 74 slices shown]
[im 1/74]
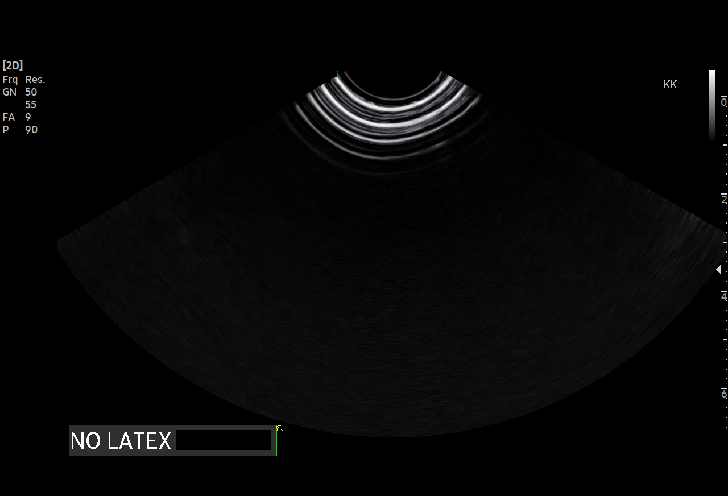
[im 6/74]
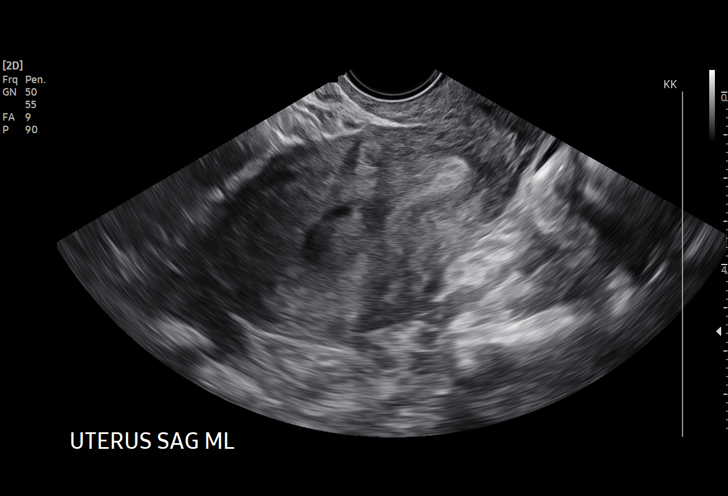
[im 11/74]
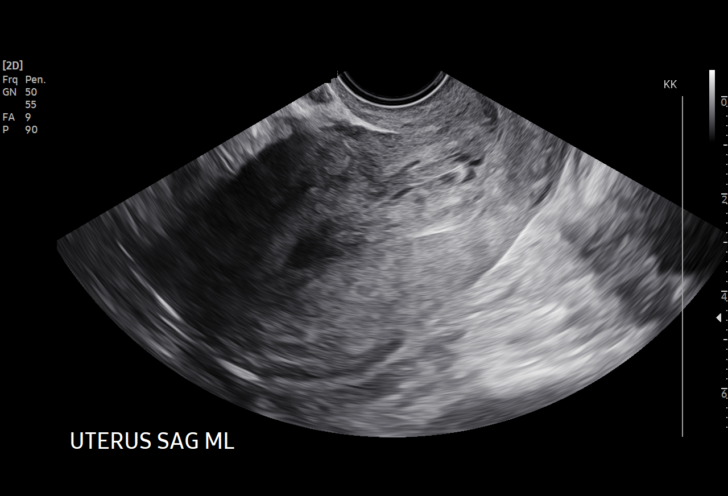
[im 17/74]
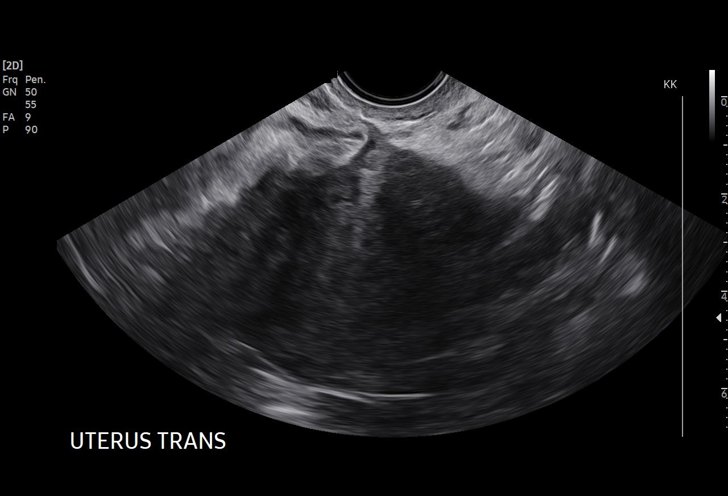
[im 22/74]
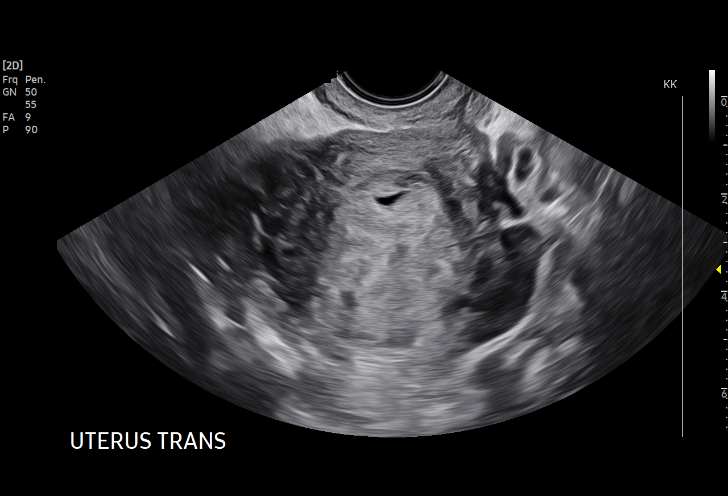
[im 28/74]
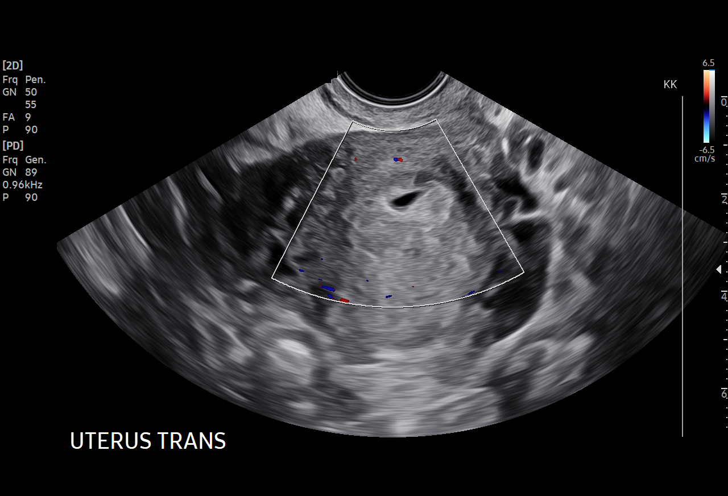
[im 33/74]
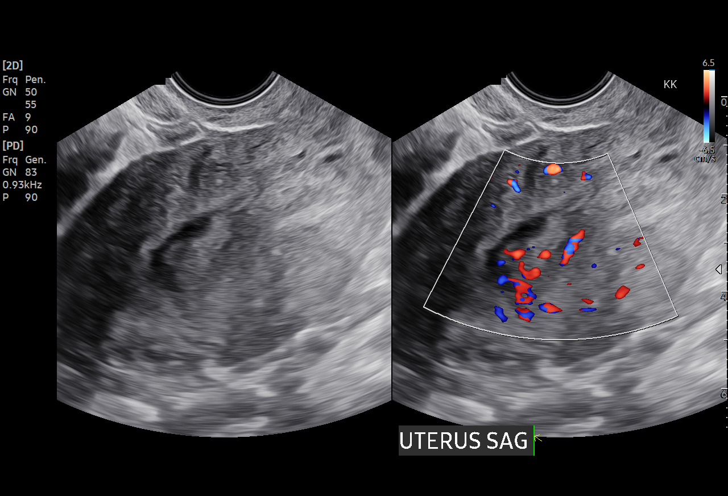
[im 38/74]
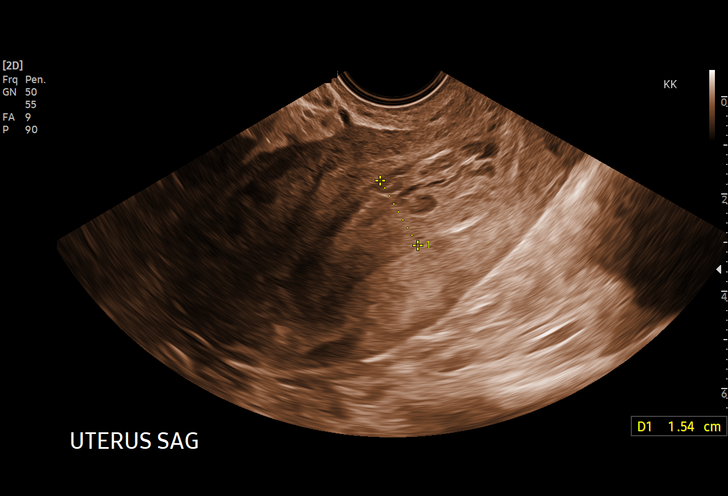
[im 41/74]
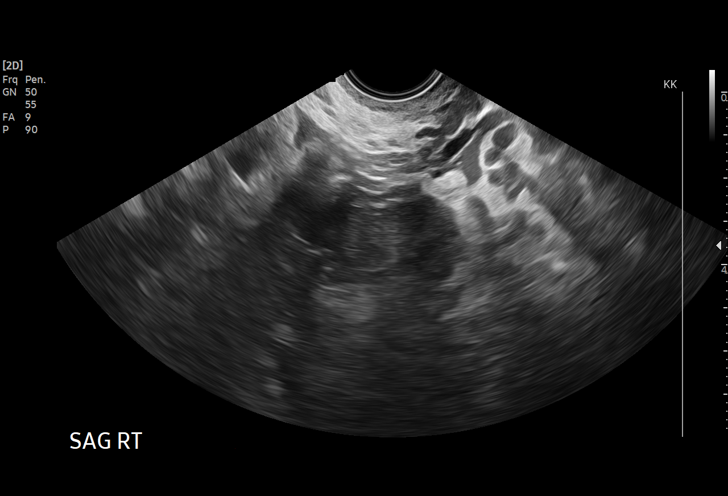
[im 46/74]
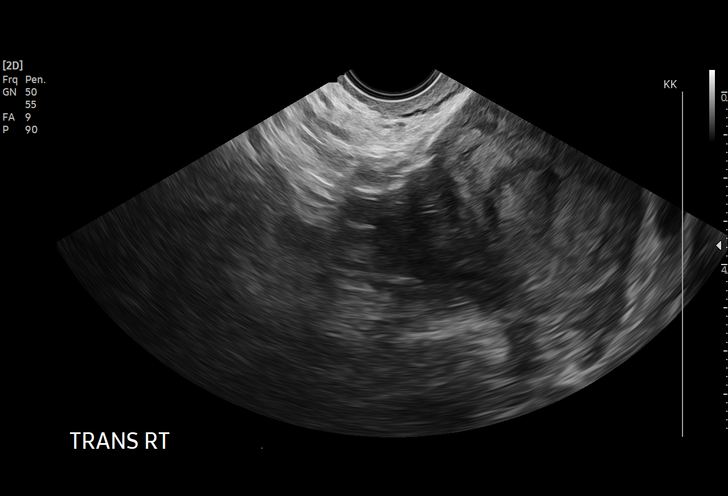
[im 52/74]
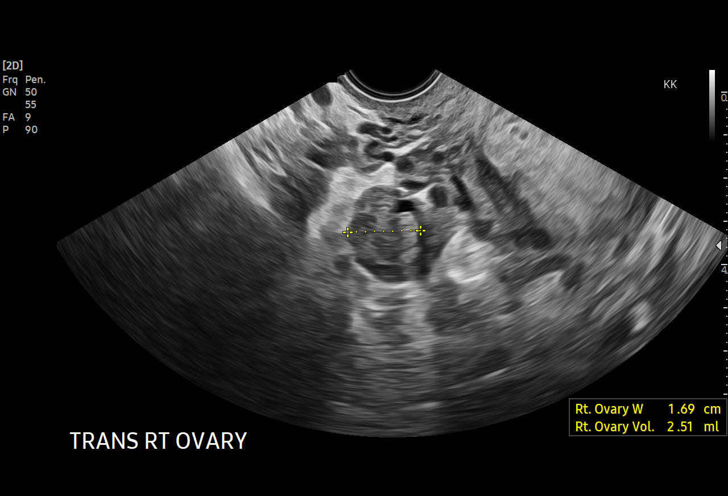
[im 57/74]
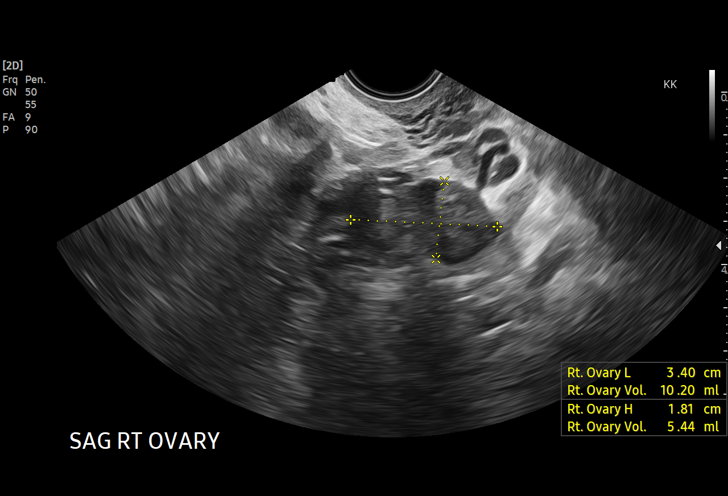
[im 63/74]
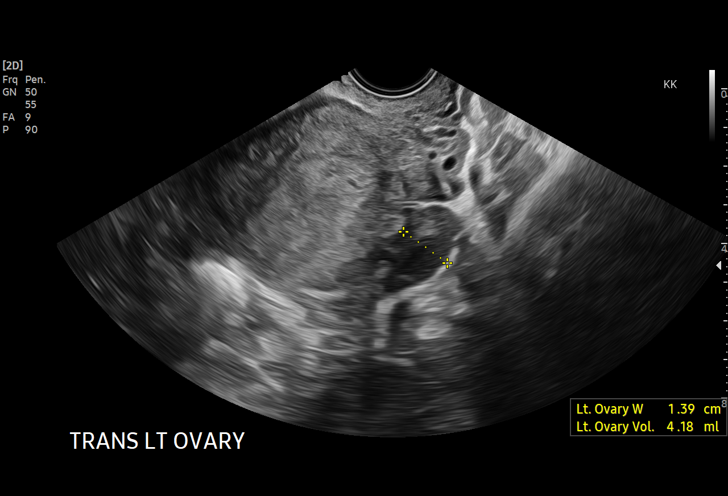
[im 68/74]
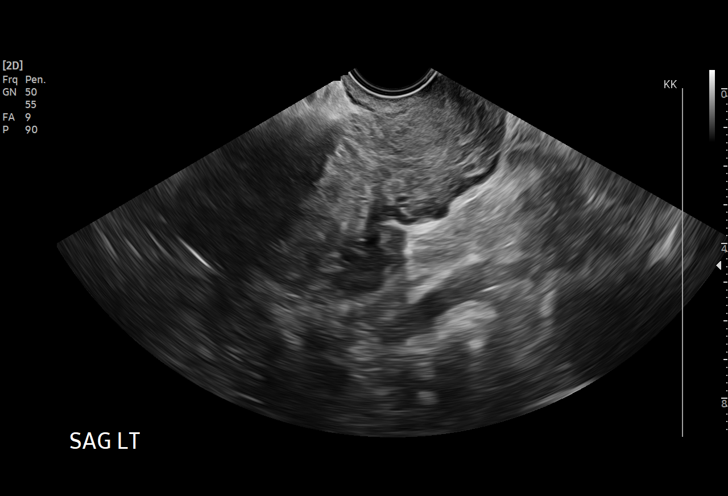
[im 74/74]
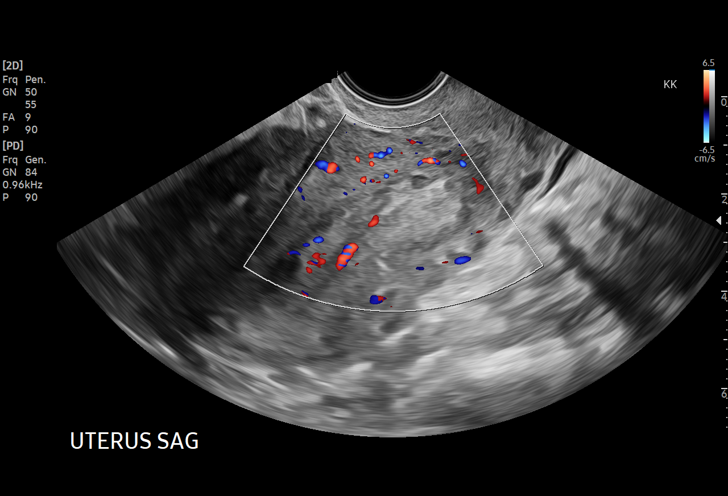

[15 of 28 positions shown; findings below may reference images not displayed]

FINDINGS: Intrauterine gestational sac: None

Yolk sac:  Not Visualized.

Embryo:  Not Visualized.

Cardiac Activity: Not Visualized.

Maternal uterus/adnexae: Thickened heterogeneous endometrium is
noted with blood flow on Doppler suggesting retained products of
conception. Ovaries are unremarkable. Trace free fluid is noted
which most likely is physiologic.
IMPRESSION: Intrauterine gestational sac noted on prior exam of [DATE] is no
longer visualized. This is consistent with reported history of
recent spontaneous abortion. Thickened heterogeneous endometrium
remains which demonstrates blood flow on color Doppler concerning
for retained products of conception.

## 2023-12-26 DIAGNOSIS — Z419 Encounter for procedure for purposes other than remedying health state, unspecified: Secondary | ICD-10-CM | POA: Diagnosis not present

## 2024-01-25 DIAGNOSIS — Z419 Encounter for procedure for purposes other than remedying health state, unspecified: Secondary | ICD-10-CM | POA: Diagnosis not present

## 2024-02-25 DIAGNOSIS — Z419 Encounter for procedure for purposes other than remedying health state, unspecified: Secondary | ICD-10-CM | POA: Diagnosis not present

## 2024-03-27 DIAGNOSIS — Z419 Encounter for procedure for purposes other than remedying health state, unspecified: Secondary | ICD-10-CM | POA: Diagnosis not present

## 2024-04-26 DIAGNOSIS — Z419 Encounter for procedure for purposes other than remedying health state, unspecified: Secondary | ICD-10-CM | POA: Diagnosis not present

## 2024-05-27 DIAGNOSIS — Z419 Encounter for procedure for purposes other than remedying health state, unspecified: Secondary | ICD-10-CM | POA: Diagnosis not present

## 2024-06-26 DIAGNOSIS — Z419 Encounter for procedure for purposes other than remedying health state, unspecified: Secondary | ICD-10-CM | POA: Diagnosis not present

## 2024-07-11 ENCOUNTER — Other Ambulatory Visit: Payer: Self-pay

## 2024-07-11 ENCOUNTER — Encounter (HOSPITAL_BASED_OUTPATIENT_CLINIC_OR_DEPARTMENT_OTHER): Payer: Self-pay | Admitting: Emergency Medicine

## 2024-07-11 ENCOUNTER — Emergency Department (HOSPITAL_BASED_OUTPATIENT_CLINIC_OR_DEPARTMENT_OTHER)
Admission: EM | Admit: 2024-07-11 | Discharge: 2024-07-11 | Disposition: A | Discharge status: Home / Self Care | Attending: Emergency Medicine | Admitting: Emergency Medicine

## 2024-07-11 DIAGNOSIS — Z3A01 Less than 8 weeks gestation of pregnancy: Secondary | ICD-10-CM | POA: Insufficient documentation

## 2024-07-11 DIAGNOSIS — O26891 Other specified pregnancy related conditions, first trimester: Secondary | ICD-10-CM | POA: Diagnosis not present

## 2024-07-11 DIAGNOSIS — O99891 Other specified diseases and conditions complicating pregnancy: Secondary | ICD-10-CM | POA: Insufficient documentation

## 2024-07-11 DIAGNOSIS — Z3201 Encounter for pregnancy test, result positive: Secondary | ICD-10-CM | POA: Diagnosis not present

## 2024-07-11 DIAGNOSIS — R17 Unspecified jaundice: Secondary | ICD-10-CM

## 2024-07-11 DIAGNOSIS — O219 Vomiting of pregnancy, unspecified: Secondary | ICD-10-CM | POA: Diagnosis not present

## 2024-07-11 MED ORDER — DOXYLAMINE-PYRIDOXINE 10-10 MG PO TBEC: 1.0000 | DELAYED_RELEASE_TABLET | Freq: Every evening | ORAL | 0 refills | Status: DC | PRN

## 2024-07-14 ENCOUNTER — Encounter: Payer: Self-pay | Admitting: Family Medicine

## 2024-08-11 ENCOUNTER — Telehealth: Payer: Self-pay

## 2024-08-11 DIAGNOSIS — Z3687 Encounter for antenatal screening for uncertain dates: Secondary | ICD-10-CM

## 2024-08-11 DIAGNOSIS — Z3A10 10 weeks gestation of pregnancy: Secondary | ICD-10-CM

## 2024-08-11 DIAGNOSIS — Z3480 Encounter for supervision of other normal pregnancy, unspecified trimester: Secondary | ICD-10-CM | POA: Insufficient documentation

## 2024-08-11 DIAGNOSIS — O219 Vomiting of pregnancy, unspecified: Secondary | ICD-10-CM

## 2024-08-11 MED ORDER — DOXYLAMINE-PYRIDOXINE 10-10 MG PO TBEC: 1.0000 | DELAYED_RELEASE_TABLET | Freq: Every evening | ORAL | 0 refills | Status: AC | PRN

## 2024-08-11 NOTE — Progress Notes (Signed)
 New OB Intake  I connected with Julie [name]  on 08/11/24 at  1:15 PM EST by MyChart Video Visit and verified that I am speaking with the correct person using two identifiers. Nurse is located at [facility] and pt is located at home.  I discussed the limitations, risks, security and privacy concerns of performing an evaluation and management service by telephone and the availability of in person appointments. I also discussed with the patient that there may be a patient responsible charge related to this service. The patient expressed understanding and agreed to proceed.  I explained I am completing New OB Intake today. We discussed EDD of 03/04/2025 based on LMP of 05/28/2024. Pt is [id number]. I reviewed her allergies, medications and Medical/Surgical/OB history.    Patient Active Problem List   Diagnosis Date Noted   Supervision of other normal pregnancy, antepartum 08/11/2024   Gastroesophageal reflux disease without esophagitis 11/07/2023   External hemorrhoid 10/16/2022   Hx of Gestational diabetes mellitus 06/14/2022   Alpha thalassemia silent carrier 05/01/2022   History of pre-eclampsia 02/01/2022     Concerns addressed today: -Patient is aware with hx of gestational diabetes, she may need to do the 2 hours GTT lab in early stage of pregnancy.   Delivery Plans Plans to deliver at [facility]. Discussed the nature of our practice with multiple providers including residents and students as well as female and female providers. Due to the size of the practice, the delivering provider may not be the same as those providing prenatal care.   Patient is not interested in water birth.  MyChart/Babyscripts MyChart access verified. I explained pt will have some visits in office and some virtually. Babyscripts instructions given and order placed. Patient verifies receipt of registration text/e-mail. Account successfully created and app downloaded. If patient is a candidate for Optimized  scheduling, add to sticky note.   Blood Pressure Cuff/Weight Scale Patient has her own blood pressure device.   Anatomy US  Patient recalled her last LMP was in November 2025 but cannot recalled what day it was. Will schedule after viability ultrasound.   Is patient a CenteringPregnancy candidate?  Declined Declined due to Schedule   Is patient a Mom+Baby Combined Care candidate?  Not a candidate   If accepted, confirm patient does not intend to move from the area for at least 12 months, then notify Mom+Baby staff  Is patient a candidate for Babyscripts Optimization? Yes, patient declined   First visit review I reviewed new OB appt with patient. Explained pt will be seen by Julie DELENA Hugger MD at first visit. Discussed Julie [name] genetic screening with patient. Yes Panorama  Routine prenatal labs is   Last Pap Will do at her initial OB visit.   Julie [name], CMA 08/11/2024  1:41 PM

## 2024-08-17 ENCOUNTER — Encounter: Payer: Self-pay | Admitting: Obstetrics & Gynecology

## 2024-08-20 ENCOUNTER — Ambulatory Visit

## 2024-08-20 ENCOUNTER — Other Ambulatory Visit: Payer: Self-pay | Admitting: Obstetrics & Gynecology

## 2024-08-20 ENCOUNTER — Other Ambulatory Visit: Payer: Self-pay

## 2024-08-20 DIAGNOSIS — Z3687 Encounter for antenatal screening for uncertain dates: Secondary | ICD-10-CM

## 2024-08-20 DIAGNOSIS — O219 Vomiting of pregnancy, unspecified: Secondary | ICD-10-CM

## 2024-08-20 DIAGNOSIS — Z3480 Encounter for supervision of other normal pregnancy, unspecified trimester: Secondary | ICD-10-CM

## 2024-08-20 DIAGNOSIS — Z3A10 10 weeks gestation of pregnancy: Secondary | ICD-10-CM

## 2024-09-07 ENCOUNTER — Encounter: Admitting: Obstetrics and Gynecology
